# Patient Record
Sex: Female | Born: 1975 | Race: Black or African American | Hispanic: No | Marital: Married | Smoking: Never smoker
Health system: Southern US, Community
[De-identification: ages and names within clinical notes are randomized; demographics above are authoritative.]

## PROBLEM LIST (undated history)

## (undated) DIAGNOSIS — D649 Anemia, unspecified: Secondary | ICD-10-CM

## (undated) DIAGNOSIS — G4730 Sleep apnea, unspecified: Secondary | ICD-10-CM

## (undated) DIAGNOSIS — F329 Major depressive disorder, single episode, unspecified: Secondary | ICD-10-CM

## (undated) DIAGNOSIS — E669 Obesity, unspecified: Secondary | ICD-10-CM

## (undated) DIAGNOSIS — F32A Depression, unspecified: Secondary | ICD-10-CM

## (undated) DIAGNOSIS — T7840XA Allergy, unspecified, initial encounter: Secondary | ICD-10-CM

## (undated) DIAGNOSIS — R0602 Shortness of breath: Secondary | ICD-10-CM

## (undated) DIAGNOSIS — M259 Joint disorder, unspecified: Secondary | ICD-10-CM

## (undated) DIAGNOSIS — E569 Vitamin deficiency, unspecified: Secondary | ICD-10-CM

## (undated) DIAGNOSIS — G43909 Migraine, unspecified, not intractable, without status migrainosus: Secondary | ICD-10-CM

## (undated) DIAGNOSIS — E559 Vitamin D deficiency, unspecified: Secondary | ICD-10-CM

## (undated) DIAGNOSIS — M62830 Muscle spasm of back: Secondary | ICD-10-CM

## (undated) DIAGNOSIS — M2550 Pain in unspecified joint: Secondary | ICD-10-CM

## (undated) DIAGNOSIS — B009 Herpesviral infection, unspecified: Secondary | ICD-10-CM

## (undated) DIAGNOSIS — D219 Benign neoplasm of connective and other soft tissue, unspecified: Secondary | ICD-10-CM

## (undated) HISTORY — DX: Obesity, unspecified: E66.9

## (undated) HISTORY — DX: Vitamin D deficiency, unspecified: E55.9

## (undated) HISTORY — PX: TUMOR REMOVAL: SHX12

## (undated) HISTORY — DX: Major depressive disorder, single episode, unspecified: F32.9

## (undated) HISTORY — DX: Herpesviral infection, unspecified: B00.9

## (undated) HISTORY — DX: Benign neoplasm of connective and other soft tissue, unspecified: D21.9

## (undated) HISTORY — DX: Sleep apnea, unspecified: G47.30

## (undated) HISTORY — DX: Vitamin deficiency, unspecified: E56.9

## (undated) HISTORY — DX: Muscle spasm of back: M62.830

## (undated) HISTORY — DX: Allergy, unspecified, initial encounter: T78.40XA

## (undated) HISTORY — DX: Shortness of breath: R06.02

## (undated) HISTORY — DX: Pain in unspecified joint: M25.50

## (undated) HISTORY — DX: Migraine, unspecified, not intractable, without status migrainosus: G43.909

## (undated) HISTORY — DX: Anemia, unspecified: D64.9

## (undated) HISTORY — DX: Depression, unspecified: F32.A

## (undated) HISTORY — DX: Joint disorder, unspecified: M25.9

---

## 1998-07-13 ENCOUNTER — Emergency Department (HOSPITAL_COMMUNITY): Admission: EM | Admit: 1998-07-13 | Discharge: 1998-07-14 | Payer: Self-pay

## 2001-04-02 ENCOUNTER — Emergency Department (HOSPITAL_COMMUNITY): Admission: EM | Admit: 2001-04-02 | Discharge: 2001-04-02 | Payer: Self-pay

## 2003-05-03 ENCOUNTER — Emergency Department (HOSPITAL_COMMUNITY): Admission: EM | Admit: 2003-05-03 | Discharge: 2003-05-03 | Payer: Self-pay | Admitting: Emergency Medicine

## 2003-05-03 ENCOUNTER — Encounter: Payer: Self-pay | Admitting: Emergency Medicine

## 2003-05-23 ENCOUNTER — Encounter: Payer: Self-pay | Admitting: Cardiology

## 2003-05-23 ENCOUNTER — Encounter: Admission: RE | Admit: 2003-05-23 | Discharge: 2003-05-23 | Payer: Self-pay | Admitting: Cardiology

## 2004-03-26 ENCOUNTER — Emergency Department (HOSPITAL_COMMUNITY): Admission: EM | Admit: 2004-03-26 | Discharge: 2004-03-26 | Payer: Self-pay | Admitting: Emergency Medicine

## 2004-04-04 ENCOUNTER — Ambulatory Visit (HOSPITAL_BASED_OUTPATIENT_CLINIC_OR_DEPARTMENT_OTHER): Admission: RE | Admit: 2004-04-04 | Discharge: 2004-04-04 | Payer: Self-pay | Admitting: Cardiology

## 2008-01-31 ENCOUNTER — Emergency Department (HOSPITAL_COMMUNITY): Admission: EM | Admit: 2008-01-31 | Discharge: 2008-01-31 | Payer: Self-pay | Admitting: Emergency Medicine

## 2008-08-23 ENCOUNTER — Encounter: Admission: RE | Admit: 2008-08-23 | Discharge: 2008-08-23 | Payer: Self-pay | Admitting: Obstetrics and Gynecology

## 2008-08-28 ENCOUNTER — Encounter: Admission: RE | Admit: 2008-08-28 | Discharge: 2008-08-28 | Payer: Self-pay | Admitting: Obstetrics and Gynecology

## 2009-08-11 ENCOUNTER — Encounter: Admission: RE | Admit: 2009-08-11 | Discharge: 2009-08-11 | Payer: Self-pay | Admitting: Obstetrics and Gynecology

## 2009-09-14 ENCOUNTER — Ambulatory Visit (HOSPITAL_COMMUNITY): Admission: RE | Admit: 2009-09-14 | Discharge: 2009-09-16 | Payer: Self-pay | Admitting: Obstetrics and Gynecology

## 2009-09-14 ENCOUNTER — Encounter (INDEPENDENT_AMBULATORY_CARE_PROVIDER_SITE_OTHER): Payer: Self-pay | Admitting: Obstetrics and Gynecology

## 2010-11-15 ENCOUNTER — Ambulatory Visit (HOSPITAL_COMMUNITY)
Admission: RE | Admit: 2010-11-15 | Discharge: 2010-11-15 | Payer: Self-pay | Source: Home / Self Care | Attending: Psychiatry | Admitting: Psychiatry

## 2010-11-18 ENCOUNTER — Other Ambulatory Visit (HOSPITAL_COMMUNITY)
Admission: RE | Admit: 2010-11-18 | Discharge: 2010-12-10 | Payer: Self-pay | Source: Home / Self Care | Attending: Psychiatry | Admitting: Psychiatry

## 2010-12-22 ENCOUNTER — Encounter: Payer: Self-pay | Admitting: Internal Medicine

## 2010-12-23 ENCOUNTER — Encounter: Payer: Self-pay | Admitting: Obstetrics and Gynecology

## 2011-03-06 LAB — CBC
HCT: 36.5 % (ref 36.0–46.0)
MCHC: 32.8 g/dL (ref 30.0–36.0)
MCHC: 32.9 g/dL (ref 30.0–36.0)
MCV: 82.5 fL (ref 78.0–100.0)
MCV: 82.3 fL (ref 78.0–100.0)
Platelets: 345 10*3/uL (ref 150–400)
Platelets: 344 10*3/uL (ref 150–400)
RBC: 4.44 MIL/uL (ref 3.87–5.11)
RDW: 15.2 % (ref 11.5–15.5)

## 2011-03-06 LAB — BASIC METABOLIC PANEL
Calcium: 9.1 mg/dL (ref 8.4–10.5)
Creatinine, Ser: 0.68 mg/dL (ref 0.4–1.2)
GFR calc Af Amer: >60 mL/min (ref >60)
GFR calc non Af Amer: >60 mL/min (ref >60)

## 2011-03-06 LAB — TYPE AND SCREEN: ABO/RH(D): B POS

## 2011-03-06 LAB — PREGNANCY, URINE: Preg Test, Ur: NEGATIVE

## 2011-03-06 LAB — ABO/RH: ABO/RH(D): B POS

## 2011-08-25 LAB — URINALYSIS, ROUTINE W REFLEX MICROSCOPIC
Bilirubin Urine: NEGATIVE
Nitrite: NEGATIVE
Specific Gravity, Urine: 1.03
Urobilinogen, UA: 1
pH: 5.5

## 2011-08-25 LAB — HCG, QUANTITATIVE, PREGNANCY: hCG, Beta Chain, Quant, S: 524 — ABNORMAL HIGH

## 2012-01-27 ENCOUNTER — Ambulatory Visit (INDEPENDENT_AMBULATORY_CARE_PROVIDER_SITE_OTHER): Payer: 59 | Admitting: Physician Assistant

## 2012-01-27 VITALS — BP 125/87 | HR 69 | Temp 98.3°F | Resp 16 | Ht 69.25 in | Wt 312.6 lb

## 2012-01-27 DIAGNOSIS — R21 Rash and other nonspecific skin eruption: Secondary | ICD-10-CM

## 2012-01-27 DIAGNOSIS — L299 Pruritus, unspecified: Secondary | ICD-10-CM

## 2012-01-27 DIAGNOSIS — J029 Acute pharyngitis, unspecified: Secondary | ICD-10-CM

## 2012-01-27 MED ORDER — MOMETASONE FUROATE 50 MCG/ACT NA SUSP: 2.0000 | Freq: Every day | NASAL | Status: DC

## 2012-01-27 MED ORDER — CETIRIZINE HCL 10 MG PO TABS: 10.0000 mg | ORAL_TABLET | Freq: Every day | ORAL | Status: DC

## 2012-01-27 MED ORDER — RANITIDINE HCL 300 MG PO TABS
300.0000 mg | ORAL_TABLET | Freq: Every day | ORAL | Status: DC
Start: 2012-01-27 — End: 2012-09-21

## 2012-01-27 MED ORDER — TRIAMCINOLONE ACETONIDE 0.5 % EX CREA: TOPICAL_CREAM | Freq: Two times a day (BID) | CUTANEOUS | Status: DC

## 2012-01-27 NOTE — Progress Notes (Signed)
  Subjective:    Patient ID: Lauren Daniels, female    DOB: 11/07/1976, 36 y.o.   MRN: [id number]  HPI Pt presents with 2 concerns 1-rash on B lower legs, R>L, started on Sat after going to the movies - got home and was really itching and noticed a few red spots.  The bumps have not changed in number or size but they are still very itching and swollen/skin tight.  She has used no meds.  Her boyfriend at movies also without any bumps.  Does not remember anything biting her.   [facility], went to another urgent care told her it may be strep and gave her prednisone which she did not start taking because it did not make sense to her. 2-sore/scratchy throat for weeks.  Used no meds has been ignoring it, has no congestion but does have h/o seasonal allergies and tends to ignore those symptoms.   Review of Systems  Constitutional: Negative for fever and chills.  HENT: Positive for congestion and sore throat (scratchy). Negative for rhinorrhea and postnasal drip.   Gastrointestinal:       No h/o heartburn symptoms.   Skin: Positive for rash.       Objective:   Physical Exam  Constitutional: She appears well-developed and well-nourished.  HENT:  Head: Normocephalic and atraumatic.  Right Ear: External ear normal.  Left Ear: External ear normal.  Nose: Mucosal edema (pale boggy) present.  Mouth/Throat: Oropharynx is clear and moist. No oropharyngeal exudate.  Lymphadenopathy:    She has no cervical adenopathy.  Skin: Skin is warm. Rash noted.             Assessment & Plan:   1. Rash  cetirizine (ZYRTEC) 10 MG tablet, ranitidine (ZANTAC) 300 MG tablet, triamcinolone cream (KENALOG) 0.5 %  2. Itching  cetirizine (ZYRTEC) 10 MG tablet, ranitidine (ZANTAC) 300 MG tablet, triamcinolone cream (KENALOG) 0.5 %  3. Acute pharyngitis  mometasone (NASONEX) 50 MCG/ACT nasal spray   Rash is bites of some sort.  ?where she got them but definitely from Sat  night at start of symptoms.

## 2012-03-14 ENCOUNTER — Emergency Department (HOSPITAL_COMMUNITY)
Admission: EM | Admit: 2012-03-14 | Discharge: 2012-03-14 | Disposition: A | Discharge status: Home / Self Care | Payer: 59 | Attending: Emergency Medicine | Admitting: Emergency Medicine

## 2012-03-14 ENCOUNTER — Encounter (HOSPITAL_COMMUNITY): Payer: Self-pay

## 2012-03-14 DIAGNOSIS — Z79899 Other long term (current) drug therapy: Secondary | ICD-10-CM | POA: Insufficient documentation

## 2012-03-14 DIAGNOSIS — R21 Rash and other nonspecific skin eruption: Secondary | ICD-10-CM

## 2012-03-14 DIAGNOSIS — M79609 Pain in unspecified limb: Secondary | ICD-10-CM | POA: Insufficient documentation

## 2012-03-14 MED ORDER — CEPHALEXIN 500 MG PO CAPS: 500.0000 mg | ORAL_CAPSULE | Freq: Four times a day (QID) | ORAL | Status: AC

## 2012-03-14 MED ORDER — HYDROCORTISONE 1 % EX CREA: TOPICAL_CREAM | CUTANEOUS | Status: DC

## 2012-03-14 MED ORDER — OXYCODONE-ACETAMINOPHEN 5-325 MG PO TABS
2.0000 | ORAL_TABLET | Freq: Once | ORAL | Status: AC
  Administered 2012-03-14: 2 via ORAL
  Filled 2012-03-14: qty 2

## 2012-03-14 MED ORDER — OXYCODONE-ACETAMINOPHEN 5-325 MG PO TABS: 2.0000 | ORAL_TABLET | ORAL | Status: AC | PRN

## 2012-03-14 MED ORDER — ONDANSETRON 4 MG PO TBDP
4.0000 mg | ORAL_TABLET | Freq: Once | ORAL | Status: AC
  Administered 2012-03-14: 4 mg via ORAL
  Filled 2012-03-14: qty 1

## 2012-03-14 MED ORDER — CLINDAMYCIN HCL 300 MG PO CAPS
300.0000 mg | ORAL_CAPSULE | Freq: Once | ORAL | Status: AC
  Administered 2012-03-14: 300 mg via ORAL
  Filled 2012-03-14: qty 1

## 2012-03-14 MED ORDER — SULFAMETHOXAZOLE-TRIMETHOPRIM 800-160 MG PO TABS: 1.0000 | ORAL_TABLET | Freq: Two times a day (BID) | ORAL | Status: AC

## 2012-03-14 NOTE — Discharge Instructions (Signed)
Rash A rash is a change in the color or texture of your skin. There are many different types of rashes. You may have other problems that accompany your rash. CAUSES   Infections.   Allergic reactions. This can include allergies to pets or foods.   Certain medicines.   Exposure to certain chemicals, soaps, or cosmetics.   Heat.   Exposure to poisonous plants.   Tumors, both cancerous and noncancerous.  SYMPTOMS   Redness.   Scaly skin.   Itchy skin.   Dry or cracked skin.   Bumps.   Blisters.   Pain.  DIAGNOSIS  Your caregiver may do a physical exam to determine what type of rash you have. A skin sample (biopsy) may be taken and examined under a microscope. TREATMENT  Treatment depends on the type of rash you have. Your caregiver may prescribe certain medicines. For serious conditions, you may need to see a skin doctor (dermatologist). HOME CARE INSTRUCTIONS   Avoid the substance that caused your rash.   Do not scratch your rash. This can cause infection.   You may take cool baths to help stop itching.   Only take over-the-counter or prescription medicines as directed by your caregiver.   Keep all follow-up appointments as directed by your caregiver.  SEEK IMMEDIATE MEDICAL CARE IF:  You have increasing pain, swelling, or redness.   You have a fever.   You have new or severe symptoms.   You have body aches, diarrhea, or vomiting.   Your rash is not better after 3 days.  MAKE SURE YOU:  Understand these instructions.   Will watch your condition.   Will get help right away if you are not doing well or get worse.  Document Released: 11/07/2002 Document Revised: 11/06/2011 Document Reviewed: 09/01/2011 Exit[employer] Patient Information 2012 [employer]. 

## 2012-03-14 NOTE — ED Provider Notes (Signed)
History     CSN: [id number]  Arrival date & time 03/14/12  2029   First MD Initiated Contact with Patient 03/14/12 2042      Chief Complaint  Patient presents with  . Cellulitis    (Consider location/radiation/quality/duration/timing/severity/associated sxs/prior treatment) HPI  Pt presents to the ED with complaints of rash on her right shin. The rash has been going on for 3 days. The patient thinks she was stung or bite by a bug but is not sure. She says that their was a bump previously. She denies fevers, chills, weakness, difficulty ambulating. The area is very painful.  History reviewed. No pertinent past medical history.  Past Surgical History  Procedure Date  . Tumor removal     fibroid    History reviewed. No pertinent family history.  History  Substance Use Topics  . Smoking status: Never Smoker   . Smokeless tobacco: Not on file  . Alcohol Use: No    OB History    Grav Para Term Preterm Abortions TAB SAB Ect Mult Living                  Review of Systems  All other systems reviewed and are negative.    Allergies  Review of patient's allergies indicates no known allergies.  Home Medications   Current Outpatient Rx  Name Route Sig Dispense Refill  . MOMETASONE FUROATE 50 MCG/ACT NA SUSP Nasal Place 2 sprays into the nose daily. 17 g 12  . CEPHALEXIN 500 MG PO CAPS Oral Take 1 capsule (500 mg total) by mouth 4 (four) times daily. 28 capsule 0  . CETIRIZINE HCL 10 MG PO TABS Oral Take 1 tablet (10 mg total) by mouth daily. 14 tablet 0  . HYDROCORTISONE 1 % EX CREA  Apply to affected area 2 times daily 15 g 0  . OXYCODONE-ACETAMINOPHEN 5-325 MG PO TABS Oral Take 2 tablets by mouth every 4 (four) hours as needed for pain. 6 tablet 0  . RANITIDINE HCL 300 MG PO TABS Oral Take 1 tablet (300 mg total) by mouth at bedtime. 14 tablet 0  . SULFAMETHOXAZOLE-TRIMETHOPRIM 800-160 MG PO TABS Oral Take 1 tablet by mouth every 12 (twelve) hours. 10 tablet 0  .  TRIAMCINOLONE ACETONIDE 0.5 % EX CREA Topical Apply topically 2 (two) times daily. 30 g 0    BP 126/75  Pulse 91  Temp(Src) 98.8 F (37.1 C) (Oral)  Resp 18  SpO2 100%  LMP 02/15/2012  Physical Exam  Nursing note and vitals reviewed. Constitutional: She appears well-developed and well-nourished. No distress.  HENT:  Head: Normocephalic and atraumatic.  Eyes: Pupils are equal, round, and reactive to light.  Neck: Normal range of motion. Neck supple.  Cardiovascular: Normal rate and regular rhythm.   Pulmonary/Chest: Effort normal.  Abdominal: Soft.  Neurological: She is alert.  Skin: Skin is warm and dry. Rash noted.          Rash 10cm by 4 cm. Spares the rest of the body. It is red and tender to touch. It is flat, the borders are irregular. No crepitus or skin peeling. No bite wound noted, no puss.     ED Course  Procedures (including critical care time)  Labs Reviewed - No data to display No results found.   1. Rash       MDM  Pt given first dose of abx in ED. She has good follow-up with her PCP and is to see her this Wednesday. Rash  was outlined, she is to return to the ED if it worsens. Pt started on hydrocortisone cream and keflex and bactrim.  The rash is painful but it is non indurated. The rash may have infectious component vs allergy.   Pt has been advised of the symptoms that warrant their return to the ED. Patient has voiced understanding and has agreed to follow-up with the PCP or specialist.         Dorthula Matas, PA 03/14/12 2239

## 2012-03-18 NOTE — ED Provider Notes (Signed)
Medical screening examination/treatment/procedure(s) were performed by non-physician practitioner and as supervising physician I was immediately available for consultation/collaboration.  Aniston Christman, MD 03/18/12 1750 

## 2012-04-28 ENCOUNTER — Telehealth: Payer: Self-pay | Admitting: Obstetrics and Gynecology

## 2012-04-28 NOTE — Telephone Encounter (Signed)
Triage/cht received 

## 2012-04-28 NOTE — Telephone Encounter (Signed)
Lm on pt's voice mail to call back rgd msg. bt cma

## 2012-04-29 ENCOUNTER — Telehealth: Payer: Self-pay | Admitting: Obstetrics and Gynecology

## 2012-04-29 NOTE — Telephone Encounter (Signed)
Left 2nd msg on pt's voice mail to call back rgd msg.bt cma

## 2012-05-03 ENCOUNTER — Encounter: Payer: Self-pay | Admitting: Obstetrics and Gynecology

## 2012-05-03 NOTE — Telephone Encounter (Signed)
Spoke with pt rgd msg. Pt stated she might have BV or yeast infection. Made app with EP on 05/05/12 @ 3:30. pts voice understanding. BT CMA

## 2012-05-05 ENCOUNTER — Encounter: Payer: Self-pay | Admitting: Obstetrics and Gynecology

## 2012-05-05 ENCOUNTER — Ambulatory Visit (INDEPENDENT_AMBULATORY_CARE_PROVIDER_SITE_OTHER): Payer: 59 | Admitting: Obstetrics and Gynecology

## 2012-05-05 VITALS — BP 120/82 | Temp 97.6°F | Ht 70.5 in | Wt 324.0 lb

## 2012-05-05 DIAGNOSIS — D219 Benign neoplasm of connective and other soft tissue, unspecified: Secondary | ICD-10-CM | POA: Insufficient documentation

## 2012-05-05 DIAGNOSIS — E559 Vitamin D deficiency, unspecified: Secondary | ICD-10-CM | POA: Insufficient documentation

## 2012-05-05 DIAGNOSIS — E569 Vitamin deficiency, unspecified: Secondary | ICD-10-CM

## 2012-05-05 DIAGNOSIS — D259 Leiomyoma of uterus, unspecified: Secondary | ICD-10-CM

## 2012-05-05 DIAGNOSIS — B3731 Acute candidiasis of vulva and vagina: Secondary | ICD-10-CM

## 2012-05-05 DIAGNOSIS — N898 Other specified noninflammatory disorders of vagina: Secondary | ICD-10-CM

## 2012-05-05 DIAGNOSIS — N926 Irregular menstruation, unspecified: Secondary | ICD-10-CM

## 2012-05-05 DIAGNOSIS — B009 Herpesviral infection, unspecified: Secondary | ICD-10-CM

## 2012-05-05 DIAGNOSIS — B373 Candidiasis of vulva and vagina: Secondary | ICD-10-CM

## 2012-05-05 LAB — POCT WET PREP (WET MOUNT)
Whiff Test: NEGATIVE
Yeast: POSITIVE
pH: 5

## 2012-05-05 LAB — POCT URINE PREGNANCY: Preg Test, Ur: NEGATIVE

## 2012-05-05 MED ORDER — FLUCONAZOLE 150 MG PO TABS: 150.0000 mg | ORAL_TABLET | Freq: Once | ORAL | Status: AC

## 2012-05-05 NOTE — Progress Notes (Signed)
35 YO complains of pruritic vaginal discharge x 2 weeks.  Hasn't used anything for it. Period only lasted 2 days this time usually lasts 5 days with pad change 4/day but only 2/day with no cramps.  O:Wet Prep: pH 5.0; whiff-negative; yeast +    UPT-negative  Pelvic: EGBUS-wnl, vagina-scant pink discharge, cervix-no lesions, uterus-no tenderness, adnexae-no tenderness   A:  Yeast Vaginitis       Irregular Menses  P: Diflucan 150 mg #1  1 po stat 1 rf      Prior to discharge patient mentioned episodes of sudden     urinary urgency for many years.  No pattern to it but it     happens most days and she's had "accidents" of leaking     Advised to schedule follow up with M.D.  Alailah Safley, PA-C

## 2012-05-05 NOTE — Progress Notes (Signed)
Color: white Odor: yes Itching:yes Thin:no Thick:yes Fever:no Dyspareunia:no Hx PID:no HX STD:yes hsv 2 Pelvic Pain:no Desires Gc/CT:no Desires HIV,RPR,HbsAG:no

## 2012-05-10 ENCOUNTER — Telehealth: Payer: Self-pay | Admitting: Obstetrics and Gynecology

## 2012-05-10 NOTE — Telephone Encounter (Signed)
Triage/epic 

## 2012-05-11 NOTE — Telephone Encounter (Signed)
EP,        PT WAS SEEN ON 05/05/12 FOR YEAST INFECTION. PT RECEIVED PILLS AND WANT TO KNOW CAN SHE GET CREAM INSTEAD.  LM

## 2012-05-12 ENCOUNTER — Telehealth: Payer: Self-pay | Admitting: Obstetrics and Gynecology

## 2012-05-12 NOTE — Telephone Encounter (Signed)
Spoke with pt rgd msg pt wanted check status of rx pt states called Monday for rx for cream for yeast infection pt seen 05/05/12 with EP was given rx diflucan pt states was told if no relief from pills call to get cream advised pt her assistant sent msg rgd request we are waiting on a response from provider to see if ok to call in rx pt became irate advised pt can use monistat otc pt states she didn't want to pay out of pocket for rx if she can get a rx and use flex card apologized for inconvenience  pt then demanded to speak to someone else a provider in office advised pt they are seeing pt and one of their assistants will call her back pt states she didn't want a cb she wants to speak to someone now pt wants to speak to a supervisor advised pt will have deborah call her back once she finish with procedure pt still irate say she will keep calling until she gets rx advised pt will forward msg to supervisor call ended

## 2012-05-12 NOTE — Telephone Encounter (Signed)
Triage/epic 

## 2012-05-12 NOTE — Telephone Encounter (Signed)
Patient recently treated for yeast vaginitis with Diflucan requesting cream since Diflucan did not relieve her symptoms. Patient may have Terazol 7 Vaginal Cream # 1 tube 1 applicatorful pv qhs x 7 days with no refills.  Jamillia Closson, PA-C

## 2012-05-13 ENCOUNTER — Telehealth: Payer: Self-pay

## 2012-05-13 NOTE — Telephone Encounter (Signed)
LM ON PT VM TO INFORM HER THAT SHE CAN PICK UP RX SHE REQUESTED PER EP. CALL IN RX TERAZOL 7 VAGINAL CREAM #1 APPLICATORFUL PV QHS X 7 DAYS WITH NO REFILLS TO PT PHARMACY.

## 2012-05-13 NOTE — Telephone Encounter (Signed)
DONE

## 2012-05-26 ENCOUNTER — Ambulatory Visit: Payer: 59 | Admitting: Obstetrics and Gynecology

## 2012-06-17 ENCOUNTER — Ambulatory Visit: Payer: 59 | Admitting: Obstetrics and Gynecology

## 2012-06-25 ENCOUNTER — Encounter: Payer: Self-pay | Admitting: Obstetrics and Gynecology

## 2012-07-02 ENCOUNTER — Telehealth: Payer: Self-pay

## 2012-09-21 ENCOUNTER — Ambulatory Visit (INDEPENDENT_AMBULATORY_CARE_PROVIDER_SITE_OTHER): Payer: 59 | Admitting: Family Medicine

## 2012-09-21 VITALS — BP 115/76 | HR 76 | Temp 98.4°F | Resp 16 | Ht 69.5 in | Wt 321.0 lb

## 2012-09-21 DIAGNOSIS — J029 Acute pharyngitis, unspecified: Secondary | ICD-10-CM

## 2012-09-21 DIAGNOSIS — G4700 Insomnia, unspecified: Secondary | ICD-10-CM

## 2012-09-21 DIAGNOSIS — H109 Unspecified conjunctivitis: Secondary | ICD-10-CM

## 2012-09-21 DIAGNOSIS — H938X9 Other specified disorders of ear, unspecified ear: Secondary | ICD-10-CM

## 2012-09-21 MED ORDER — AMOXICILLIN 875 MG PO TABS: 875.0000 mg | ORAL_TABLET | Freq: Two times a day (BID) | ORAL | Status: DC

## 2012-09-21 MED ORDER — TOBRAMYCIN 0.3 % OP SOLN: 1.0000 [drp] | Freq: Four times a day (QID) | OPHTHALMIC | Status: DC

## 2012-09-21 NOTE — Progress Notes (Signed)
35 yo with a week and a half of injected, sticky eyes; sore throat; ear fullness; myalgias.   Associated with fever (101) at onset along with some lightheadedness.  She saw physician in [place] last week who prescribed sudafed  Works at [employer] in [place]  Objective: NAD Eyes:  Injected, L>R with discharge, fundi benign Nose:  Inflamed with mucopurulent discharge Oroph:  Reddened with small ulcers left posterior pharynx and yellow PND Neck:   Supple, no adenopathy TM's:  Normal  Assessment:  Pharyngitis and conjunctivitis

## 2012-09-28 ENCOUNTER — Encounter: Payer: 59 | Admitting: Emergency Medicine

## 2012-09-28 NOTE — Progress Notes (Signed)
  Subjective:    Patient ID: Lauren Daniels, female    DOB: 11-14-1976, 36 y.o.   MRN: [id number]  HPI  NOT SEEN  Review of Systems NOT SEEN     Objective:   Physical Exam  NOT SEEN      Assessment & Plan:  NOT SEEN This encounter was created in error - please disregard.

## 2012-10-15 ENCOUNTER — Ambulatory Visit (INDEPENDENT_AMBULATORY_CARE_PROVIDER_SITE_OTHER): Payer: 59 | Admitting: Emergency Medicine

## 2012-10-15 VITALS — BP 136/82 | HR 100 | Temp 98.0°F | Resp 17 | Ht 70.0 in | Wt 320.0 lb

## 2012-10-15 DIAGNOSIS — Z0000 Encounter for general adult medical examination without abnormal findings: Secondary | ICD-10-CM

## 2012-10-15 LAB — POCT URINALYSIS DIPSTICK
Bilirubin, UA: NEGATIVE
Glucose, UA: NEGATIVE
Nitrite, UA: NEGATIVE
Spec Grav, UA: 1.015
Urobilinogen, UA: 0.2

## 2012-10-15 LAB — COMPREHENSIVE METABOLIC PANEL
Albumin: 4.3 g/dL (ref 3.5–5.2)
Alkaline Phosphatase: 55 U/L (ref 39–117)
BUN: 7 mg/dL (ref 6–23)
Calcium: 9.5 mg/dL (ref 8.4–10.5)
Chloride: 104 mEq/L (ref 96–112)
Glucose, Bld: 84 mg/dL (ref 70–99)
Potassium: 4.4 mEq/L (ref 3.5–5.3)
Sodium: 138 mEq/L (ref 135–145)
Total Protein: 7.3 g/dL (ref 6.0–8.3)

## 2012-10-15 LAB — POCT CBC
Granulocyte percent: 43.1 %G (ref 37–80)
HCT, POC: 41.5 % (ref 37.7–47.9)
Hemoglobin: 12.2 g/dL (ref 12.2–16.2)
MCV: 83.7 fL (ref 80–97)
POC Granulocyte: 2.5 (ref 2–6.9)
POC LYMPH PERCENT: 50.6 %L — AB (ref 10–50)
RBC: 4.96 M/uL (ref 4.04–5.48)
RDW, POC: 16.4 %

## 2012-10-15 LAB — POCT UA - MICROSCOPIC ONLY

## 2012-10-15 LAB — TSH: TSH: 1.683 u[IU]/mL (ref 0.350–4.500)

## 2012-10-15 LAB — LIPID PANEL
LDL Cholesterol: 87 mg/dL (ref 0–99)
Triglycerides: 63 mg/dL (ref <150)

## 2012-10-15 NOTE — Patient Instructions (Addendum)
Obesity Obesity is defined as having too much total body fat and a body mass index (BMI) of 30 or more. BMI is an estimate of body fat and is calculated from your height and weight. Obesity happens when you consume more calories than you can burn by exercising or performing daily physical tasks. Prolonged obesity can cause major illnesses or emergencies, such as:   A stroke.  Heart disease.  Diabetes.  Cancer.  Arthritis.  High blood pressure (hypertension).  High cholesterol.  Sleep apnea.  Erectile dysfunction.  Infertility problems. CAUSES   Regularly eating unhealthy foods.  Physical inactivity.  Certain disorders, such as an underactive thyroid (hypothyroidism), Cushing's syndrome, and polycystic ovarian syndrome.  Certain medicines, such as steroids, some depression medicines, and antipsychotics.  Genetics.  Lack of sleep. DIAGNOSIS  A caregiver can diagnose obesity after calculating your BMI. Obesity will be diagnosed if your BMI is 30 or higher.  There are other methods of measuring obesity levels. Some other methods include measuring your skin fold thickness, your waist circumference, and comparing your hip circumference to your waist circumference. TREATMENT  A healthy treatment program includes some or all of the following:  Long-term dietary changes.  Exercise and physical activity.  Behavioral and lifestyle changes.  Medicine only under the supervision of your caregiver. Medicines may help, but only if they are used with diet and exercise programs. An unhealthy treatment program includes:  Fasting.  Fad diets.  Supplements and drugs. These choices do not succeed in long-term weight control.  HOME CARE INSTRUCTIONS   Exercise and perform physical activity as directed by your caregiver. To increase physical activity, try the following:  Use stairs instead of elevators.  Park farther away from store entrances.  Garden, bike, or walk instead of  watching television or using the computer.  Eat healthy, low-calorie foods and drinks on a regular basis. Eat more fruits and vegetables. Use low-calorie cookbooks or take healthy cooking classes.  Limit fast food, sweets, and processed snack foods.  Eat smaller portions.  Keep a daily journal of everything you eat. There are many free websites to help you with this. It may be helpful to measure your foods so you can determine if you are eating the correct portion sizes.  Avoid drinking alcohol. Drink more water and drinks without calories.  Take vitamins and supplements only as recommended by your caregiver.  Weight-loss support groups, Registered Dieticians, counselors, and stress reduction education can also be very helpful. SEEK IMMEDIATE MEDICAL CARE IF:  You have chest pain or tightness.  You have trouble breathing or feel short of breath.  You have weakness or leg numbness.  You feel confused or have trouble talking.  You have sudden changes in your vision. MAKE SURE YOU:  Understand these instructions.  Will watch your condition.  Will get help right away if you are not doing well or get worse. Document Released: 12/25/2004 Document Revised: 05/18/2012 Document Reviewed: 12/24/2011 Exit[employer] Patient Information 2013 [employer].  

## 2012-10-15 NOTE — Progress Notes (Signed)
@[email or web address] @  Patient ID: Lauren Daniels MRN: [id number], DOB: 1976/08/25, 36 y.o. Date of Encounter: 10/15/2012, 9:08 AM  Primary Physician: Pcp Not In System  Chief Complaint: Physical (CPE)  HPI: 36 y.o. y/o female with history of noted below here for CPE.  Doing well. Needs complete PE for work.    LMP: 10/10/12 WUJ:[id number] performs PAP's. MMG: Review of Systems:  Consitutional: No fever, chills, fatigue, night sweats, lymphadenopathy, or weight changes. Eyes: No visual changes, eye redness, or discharge. ENT/Mouth: Ears: No otalgia, tinnitus, hearing loss, discharge. Nose: No congestion, rhinorrhea, sinus pain, or epistaxis. Throat: No sore throat, post nasal drip, or teeth pain. Cardiovascular: No CP, palpitations, diaphoresis, DOE, edema, orthopnea, PND. Respiratory: She has a history of exercise-induced asthma she has Dulera to take but rarely takes it. As a pro-air inhaler to use as  Gastrointestinal: No anorexia, dysphagia, reflux, pain, nausea, vomiting, hematemesis, diarrhea, constipation, BRBPR, or melena. Breast: No discharge, pain, swelling, or mass. Genitourinary: No dysuria, frequency, urgency, hematuria, incontinence, nocturia, amenorrhea, vaginal discharge, pruritis, burning, abnormal bleeding, or pain. Musculoskeletal: Bilateral hip pain (toss and turn during the night) Obese. No decreased ROM, myalgias, stiffness, joint swelling, or weakness.  Skin: No rash, erythema, lesion changes, pain, warmth, jaundice, or pruritis. Neurological: + dizziness off and during mensus, syncope, seizures, tremors, memory loss, coordination problems, or paresthesias. Psychological: No anxiety, depression, hallucinations, SI/HI. Endocrine: No fatigue, polydipsia, polyphagia, polyuria, or known diabetes. SLEEP: Has trouble slee All other systems were reviewed and are otherwise negative.  Past Medical History  Diagnosis Date  . Fibroid   . Vitamin deficiency     HX OF LOW VIT D   .  HSV-2 (herpes simplex virus 2) infection   . Allergy      Past Surgical History  Procedure Date  . Tumor removal     fibroid    Home Meds:  Prior to Admission medications   Medication Sig Start Date End Date Taking? Authorizing Provider  amoxicillin (AMOXIL) 875 MG tablet Take 1 tablet (875 mg total) by mouth 2 (two) times daily. 09/21/12   Elvina Sidle, MD  tobramycin (TOBREX) 0.3 % ophthalmic solution Place 1 drop into both eyes every 6 (six) hours. 09/21/12   Elvina Sidle, MD    Allergies:  Allergies  Allergen Reactions  . Eye Drops (Naphazoline-Polyethyl Glycol)     History   Social History  . Marital Status: Single    Spouse Name: N/A    Number of Children: N/A  . Years of Education: N/A   Occupational History  . Not on file.   Social History Main Topics  . Smoking status: Never Smoker   . Smokeless tobacco: Never Used  . Alcohol Use: No  . Drug Use: No  . Sexually Active: Yes    Birth Control/ Protection: None   Other Topics Concern  . Not on file   Social History Narrative  . No narrative on file    Family History  Problem Relation Age of Onset  . Breast cancer Mother 14  . Breast cancer Maternal Grandmother 40  . Colon cancer Maternal Grandmother   . Diabetes Father     Physical Exam  Blood pressure 136/82, pulse 100, temperature 98 F (36.7 C), temperature source Oral, resp. rate 17, height 5\' 10"  (1.778 m), weight 320 lb (145.151 kg), last menstrual period 10/09/2012, SpO2 99.00%., Body mass index is 45.92 kg/(m^2). General: Well developed, well nourished, in no acute distress. HEENT: Normocephalic, atraumatic. Conjunctiva pink, sclera non-icteric. Pupils  2 mm constricting to 1 mm, round, regular, and equally reactive to light and accomodation. EOMI. Internal auditory canal clear. TMs with good cone of light and without pathology. Nasal mucosa pink. Nares are without discharge. No sinus tenderness. Oral mucosa pink. Dentition normal .  Pharynx without exudate.   Neck: Supple. Trachea midline. No thyromegaly. Full ROM. No lymphadenopathy. Lungs: Clear to auscultation bilaterally without wheezes, rales, or rhonchi. Breathing is of normal effort and unlabored. Cardiovascular: RRR with S1 S2. No murmurs, rubs, or gallops appreciated. Distal pulses 2+ symmetrically. No carotid or abdominal bruits.  Breast: Not examined   Abdomen: Soft, non-tender, non-distended with normoactive bowel sounds. No hepatosplenomegaly or masses. No rebound/guarding. No CVA tenderness. Without hernias.  Genitourinary:  Not examined   Musculoskeletal: Full range of motion and 5/5 strength throughout. Without swelling, atrophy, tenderness, crepitus, or warmth. Extremities without clubbing, cyanosis, or edema. Calves supple. Skin: Warm and moist without erythema, ecchymosis, wounds, or rash. Neuro: A+Ox3. CN II-XII grossly intact. Moves all extremities spontaneously. Full sensation throughout. Normal gait. DTR 2+ throughout upper and lower extremities. Finger to nose intact. Psych:  Responds to questions appropriately with a normal affect.   Results for orders placed in visit on 10/15/12  POCT CBC      Component Value Range   WBC 5.9  4.6 - 10.2 K/uL   Lymph, poc 3.0  0.6 - 3.4   POC LYMPH PERCENT 50.6 (*) 10 - 50 %L   MID (cbc) 0.4  0 - 0.9   POC MID % 6.3  0 - 12 %M   POC Granulocyte 2.5  2 - 6.9   Granulocyte percent 43.1  37 - 80 %G   RBC 4.96  4.04 - 5.48 M/uL   Hemoglobin 12.2  12.2 - 16.2 g/dL   HCT, POC 16.1  09.6 - 47.9 %   MCV 83.7  80 - 97 fL   MCH, POC 24.6 (*) 27 - 31.2 pg   MCHC 29.4 (*) 31.8 - 35.4 g/dL   RDW, POC 04.5     Platelet Count, POC 424  142 - 424 K/uL   MPV 8.0  0 - 99.8 fL  POCT URINALYSIS DIPSTICK      Component Value Range   Color, UA yellow     Clarity, UA sl cloudy     Glucose, UA neg     Bilirubin, UA neg     Ketones, UA neg     Spec Grav, UA 1.015     Blood, UA large     pH, UA 7.5     Protein, UA neg      Urobilinogen, UA 0.2     Nitrite, UA neg     Leukocytes, UA Negative    POCT UA - MICROSCOPIC ONLY      Component Value Range   WBC, Ur, HPF, POC 0-1     RBC, urine, microscopic 1-3     Bacteria, U Microscopic trace     Mucus, UA neg     Epithelial cells, urine per micros 4-7(2-4)Clue     Crystals, Ur, HPF, POC neg     Casts, Ur, LPF, POC neg     Yeast, UA neg      EKG they're PACs seen no other abnormalities    Assessment/Plan:  36 y.o. y/o female here for CPE. Needs completed for work.  C/o of bilateral hip pain that causes her to toss and turn throughout the night, and when walking. Patient needs to  work on weight loss and exercise. She will continue her regular followup with her GYN. She did have some blood in urine but just finished her menses yesterday. Routine labs were done she states she is up-to-date on her tetanus shot. She declined a flu shot today. We discussed her family history of having breast cancer. She is going to see Dr. Stefano Gaul and decide about a mammogram or a need for an MRI. -  Signed, Earl Lites, MD 10/15/2012 9:08 AM

## 2012-10-19 ENCOUNTER — Encounter: Payer: Self-pay | Admitting: *Deleted

## 2012-10-22 ENCOUNTER — Ambulatory Visit: Payer: 59 | Admitting: Family Medicine

## 2013-04-14 ENCOUNTER — Ambulatory Visit (INDEPENDENT_AMBULATORY_CARE_PROVIDER_SITE_OTHER): Payer: BC Managed Care – PPO | Admitting: Physician Assistant

## 2013-04-14 ENCOUNTER — Ambulatory Visit: Payer: BC Managed Care – PPO

## 2013-04-14 ENCOUNTER — Encounter: Payer: Self-pay | Admitting: Physician Assistant

## 2013-04-14 VITALS — BP 145/75 | HR 105 | Temp 98.3°F | Resp 16 | Ht 69.25 in | Wt 324.6 lb

## 2013-04-14 DIAGNOSIS — B009 Herpesviral infection, unspecified: Secondary | ICD-10-CM

## 2013-04-14 DIAGNOSIS — M5380 Other specified dorsopathies, site unspecified: Secondary | ICD-10-CM

## 2013-04-14 DIAGNOSIS — Z13220 Encounter for screening for lipoid disorders: Secondary | ICD-10-CM

## 2013-04-14 DIAGNOSIS — R5381 Other malaise: Secondary | ICD-10-CM

## 2013-04-14 DIAGNOSIS — E569 Vitamin deficiency, unspecified: Secondary | ICD-10-CM

## 2013-04-14 DIAGNOSIS — M25475 Effusion, left foot: Secondary | ICD-10-CM

## 2013-04-14 DIAGNOSIS — E669 Obesity, unspecified: Secondary | ICD-10-CM

## 2013-04-14 DIAGNOSIS — M62830 Muscle spasm of back: Secondary | ICD-10-CM

## 2013-04-14 DIAGNOSIS — R3915 Urgency of urination: Secondary | ICD-10-CM

## 2013-04-14 DIAGNOSIS — R5383 Other fatigue: Secondary | ICD-10-CM

## 2013-04-14 DIAGNOSIS — M25473 Effusion, unspecified ankle: Secondary | ICD-10-CM

## 2013-04-14 LAB — COMPREHENSIVE METABOLIC PANEL
AST: 14 U/L (ref 0–37)
Albumin: 4.2 g/dL (ref 3.5–5.2)
BUN: 8 mg/dL (ref 6–23)
CO2: 25 mEq/L (ref 19–32)
Calcium: 9.5 mg/dL (ref 8.4–10.5)
Chloride: 104 mEq/L (ref 96–112)
Glucose, Bld: 69 mg/dL — ABNORMAL LOW (ref 70–99)
Potassium: 4.1 mEq/L (ref 3.5–5.3)

## 2013-04-14 LAB — LIPID PANEL
Cholesterol: 154 mg/dL (ref 0–200)
HDL: 49 mg/dL (ref >39)

## 2013-04-14 LAB — POCT URINALYSIS DIPSTICK
Glucose, UA: NEGATIVE
Ketones, UA: NEGATIVE
Protein, UA: NEGATIVE
Spec Grav, UA: <=1.005

## 2013-04-14 LAB — POCT UA - MICROSCOPIC ONLY
Bacteria, U Microscopic: NEGATIVE
Crystals, Ur, HPF, POC: NEGATIVE

## 2013-04-14 LAB — GLUCOSE, POCT (MANUAL RESULT ENTRY): POC Glucose: 74 mg/dl (ref 70–99)

## 2013-04-14 LAB — CBC WITH DIFFERENTIAL/PLATELET
HCT: 35.7 % — ABNORMAL LOW (ref 36.0–46.0)
Hemoglobin: 11.6 g/dL — ABNORMAL LOW (ref 12.0–15.0)
Lymphocytes Relative: 51 % — ABNORMAL HIGH (ref 12–46)
Lymphs Abs: 3.4 10*3/uL (ref 0.7–4.0)
Monocytes Absolute: 0.4 10*3/uL (ref 0.1–1.0)
Monocytes Relative: 6 % (ref 3–12)
Neutro Abs: 2.8 10*3/uL (ref 1.7–7.7)
WBC: 6.7 10*3/uL (ref 4.0–10.5)

## 2013-04-14 LAB — POCT URINE PREGNANCY: Preg Test, Ur: NEGATIVE

## 2013-04-14 MED ORDER — MELOXICAM 15 MG PO TABS: 15.0000 mg | ORAL_TABLET | Freq: Every day | ORAL | Status: DC

## 2013-04-14 MED ORDER — CYCLOBENZAPRINE HCL 10 MG PO TABS: 10.0000 mg | ORAL_TABLET | Freq: Three times a day (TID) | ORAL | Status: DC | PRN

## 2013-04-14 NOTE — Patient Instructions (Signed)
Keep a diary of everything you eat and drink, and of all your exercise.  Bring it with you for review at your follow-up visit.

## 2013-04-14 NOTE — Progress Notes (Signed)
Subjective:    Patient ID: Lauren Daniels, female    DOB: May 24, 1976, 37 y.o.   MRN: [id number]  HPI This 37 y.o. female presents for CPE.  However, during the preparation for her visit with me, she developed sudden onset of debilitating back back.  As such, we elected to manage the acute problem and defer the CPE to a later date.  She had fasting labs drawn this morning.  Leaning over to wipe off the toilet seat, she felt pain and heard a "pop" in the low back.  She sat down, but was unable to stand back up due to the pain.  Two staff members came to her assistance and moved her into an exam room.  The pain is constant, worse with movement.  No pain radiating into the legs.  No loss of bowel or bladder control, no saddle anesthesia.    She's had similar episodes previously, but none this severe.  Also, swelling in LEFT foot x 5+ months-ever since "they took our chairs" at work Psychologist, occupational, works at [employer], counter is lower than her waist, and she also has to lean over to write on the counter.). Pain in the foot x several weeks.  Has tried multiple types of shoes. Has had some hip discomfort. History of severe LEFT foot sprain in 2004.  Past medical history, surgical history, family history, social history and problem list reviewed.   Review of Systems As above.  Also, frustration with difficulty losing weight.  Feels tired a lot. No chest pain, SOB, HA, dizziness, vision change, N/V, diarrhea, constipation, dysuria, urinary urgency or frequency or rash.     Objective:   Physical Exam  Vitals reviewed. Constitutional: She is oriented to person, place, and time. She appears well-developed and well-nourished. She is active and cooperative. She appears distressed (crying.  Unable to be comfortable on the exam table.  All movement hurts.).  Tachycardic, likely due to pain.  HENT:  Head: Normocephalic and atraumatic.  Eyes: Conjunctivae are normal.  Neck: Neck supple.    Cardiovascular: Regular rhythm and normal heart sounds.   Pulmonary/Chest: Effort normal and breath sounds normal. No respiratory distress.  Musculoskeletal: She exhibits tenderness. She exhibits no edema.       Cervical back: Normal.       Thoracic back: Normal.       Lumbar back: She exhibits decreased range of motion, tenderness, bony tenderness and pain. She exhibits no swelling, no edema, no deformity, no laceration, no spasm and normal pulse.       Back:  Neurological: She is alert and oriented to person, place, and time. She has normal strength and normal reflexes. No cranial nerve deficit or sensory deficit. Coordination normal.  Skin: Skin is warm and dry. No rash noted. She is not diaphoretic.  Psychiatric: She has a normal mood and affect. Her behavior is normal. Thought content normal.   LS spine: [facility] reading (PRIMARY) by  Dr. Audria Nine.  Abnormal appearance of L5-S1, possible normal variant for this patient.  No other bony deformity noted.     Assessment & Plan:  Back spasm - Plan: DG Lumbar Spine Complete, meloxicam (MOBIC) 15 MG tablet, cyclobenzaprine (FLEXERIL) 10 MG tablet; modified duty.  Swelling of foot joint, left - see above.  Other issues partially addressed, given that labs were already drawn this morning.  Additional evaluation defered to a follow-up visit in 4 weeks, when her acute pain is expected to have resolved.    HSV-2 (herpes  simplex virus 2) infection  Vitamin deficiency - Plan: Vitamin D 25 hydroxy  Fatigue - Plan: POCT glucose (manual entry), CBC with Differential, Comprehensive metabolic panel, TSH, POCT urine pregnancy  Urinary urgency - Plan: POCT UA - Microscopic Only, POCT urinalysis dipstick, POCT urine pregnancy  Screening for hyperlipidemia - Plan: Lipid panel  Obesity  Fernande Bras, PA-C Physician Assistant-Certified [facility]

## 2013-04-15 LAB — TSH: TSH: 2.211 u[IU]/mL (ref 0.350–4.500)

## 2013-04-16 ENCOUNTER — Telehealth: Payer: Self-pay

## 2013-04-16 MED ORDER — ERGOCALCIFEROL 1.25 MG (50000 UT) PO CAPS: 50000.0000 [IU] | ORAL_CAPSULE | ORAL | Status: DC

## 2013-04-16 NOTE — Addendum Note (Signed)
Addended by: Fernande Bras on: 04/16/2013 05:21 PM   Modules accepted: Orders

## 2013-04-18 MED ORDER — VITAMIN D 1000 UNITS PO CAPS: 1000.0000 [IU] | ORAL_CAPSULE | Freq: Every day | ORAL | Status: DC

## 2013-04-18 NOTE — Telephone Encounter (Signed)
Sent in for her

## 2013-05-12 ENCOUNTER — Ambulatory Visit: Payer: BC Managed Care – PPO | Admitting: Physician Assistant

## 2013-08-23 ENCOUNTER — Other Ambulatory Visit: Payer: Self-pay | Admitting: Obstetrics and Gynecology

## 2013-08-23 DIAGNOSIS — N644 Mastodynia: Secondary | ICD-10-CM

## 2013-09-02 ENCOUNTER — Ambulatory Visit
Admission: RE | Admit: 2013-09-02 | Discharge: 2013-09-02 | Disposition: A | Discharge status: Home / Self Care | Payer: BC Managed Care – PPO | Source: Ambulatory Visit | Attending: Obstetrics and Gynecology | Admitting: Obstetrics and Gynecology

## 2013-09-02 DIAGNOSIS — N644 Mastodynia: Secondary | ICD-10-CM

## 2013-10-06 ENCOUNTER — Other Ambulatory Visit: Payer: Self-pay

## 2013-10-13 DIAGNOSIS — F509 Eating disorder, unspecified: Secondary | ICD-10-CM | POA: Insufficient documentation

## 2013-10-13 DIAGNOSIS — E668 Other obesity: Secondary | ICD-10-CM | POA: Insufficient documentation

## 2013-10-19 ENCOUNTER — Ambulatory Visit: Payer: BC Managed Care – PPO | Admitting: *Deleted

## 2013-11-01 ENCOUNTER — Ambulatory Visit: Payer: BC Managed Care – PPO | Admitting: *Deleted

## 2014-01-13 ENCOUNTER — Ambulatory Visit (INDEPENDENT_AMBULATORY_CARE_PROVIDER_SITE_OTHER): Payer: BC Managed Care – PPO | Admitting: Family Medicine

## 2014-01-13 VITALS — BP 132/74 | HR 95 | Temp 98.7°F | Resp 16 | Ht 70.0 in | Wt 318.8 lb

## 2014-01-13 DIAGNOSIS — J309 Allergic rhinitis, unspecified: Secondary | ICD-10-CM

## 2014-01-13 DIAGNOSIS — N926 Irregular menstruation, unspecified: Secondary | ICD-10-CM

## 2014-01-13 DIAGNOSIS — R21 Rash and other nonspecific skin eruption: Secondary | ICD-10-CM

## 2014-01-13 DIAGNOSIS — N644 Mastodynia: Secondary | ICD-10-CM

## 2014-01-13 LAB — POCT SKIN KOH: Skin KOH, POC: NEGATIVE

## 2014-01-13 LAB — POCT URINE PREGNANCY: PREG TEST UR: NEGATIVE

## 2014-01-13 MED ORDER — NYSTATIN-TRIAMCINOLONE 100000-0.1 UNIT/GM-% EX OINT: 1.0000 "application " | TOPICAL_OINTMENT | Freq: Two times a day (BID) | CUTANEOUS | Status: DC

## 2014-01-13 NOTE — Progress Notes (Addendum)
Subjective:    Patient ID: Lauren Daniels, female    DOB: 01-19-1976, 38 y.o.   MRN: [id number] This chart was scribed for Wardell Honour, MD by Anastasia Pall, ED Scribe. This patient was seen in room 01 and the patient's care was started at 6:57 PM.  Chief Complaint  Patient presents with  . Rash  . Pregnancy Test   HPI Lauren Struve is a 38 y.o. female Pt presents with dry, itching rash over the left side of her neck, onset 1 week ago. She denies rash on her chest and anywhere else. She denies wearing necklaces, new soaps, detergents. She reports holding someone else's baby within the last week, being worried rash was from that. She reports going to the beach, wondering if rash is from pillows.   She reports thinking she has sinus infection. She reports h/o sinus infections. She reports nasal congestion, intermittent cough, and rhinorrhea, with yellow discharge. She denies fever, chills, sweats, headaches, sinus pressure, cough. She denies having taken Claritin, Nasanex for 1 month due to financial issues. She reports she is now able to obtain those medications.   She reports her LNMP was 1.5 weeks ago. She reports it was shorter than usual. She denies being on birth control.  Requesting pregnancy test.  PCP - JEFFERY,CHELLE, PA-C  Patient Active Problem List   Diagnosis Date Noted  . Back spasm 04/14/2013  . Fibroid   . Vitamin D deficiency   . HSV-2 (herpes simplex virus 2) infection    Past Medical History  Diagnosis Date  . Fibroid   . Vitamin deficiency     HX OF LOW VIT D   . HSV-2 (herpes simplex virus 2) infection   . Allergy   . Back spasm    Past Surgical History  Procedure Laterality Date  . Tumor removal      fibroid   Allergies  Allergen Reactions  . Eye Drops [Naphazoline-Polyethyl Glycol]    Prior to Admission medications   Medication Sig Start Date End Date Taking? Authorizing Provider  Cholecalciferol (VITAMIN D) 1000 UNITS capsule Take 1 capsule  (1,000 Units total) by mouth daily. 04/18/13  Yes Eleanore Kurtis Bushman, PA-C  cyclobenzaprine (FLEXERIL) 10 MG tablet Take 1 tablet (10 mg total) by mouth 3 (three) times daily as needed for muscle spasms. 04/14/13  Yes Chelle S Jeffery, PA-C  ergocalciferol (VITAMIN D2) 50000 UNITS capsule Take 1 capsule (50,000 Units total) by mouth once a week. 04/16/13  Yes Chelle S Jeffery, PA-C  loratadine (CLARITIN) 10 MG tablet Take 10 mg by mouth as needed for allergies.   Yes Historical Provider, MD  meloxicam (MOBIC) 15 MG tablet Take 1 tablet (15 mg total) by mouth daily. 04/14/13  Yes Chelle S Jeffery, PA-C  mometasone (NASONEX) 50 MCG/ACT nasal spray Place 2 sprays into the nose daily.   Yes Historical Provider, MD  valACYclovir (VALTREX) 500 MG tablet Take 500 mg by mouth as needed. BID x 3 days, prn outbreak 04/01/13  Yes Historical Provider, MD   Review of Systems  Constitutional: Negative for fever, chills and diaphoresis.  HENT: Positive for congestion (nasal), rhinorrhea and sneezing. Negative for ear pain, postnasal drip, sinus pressure and sore throat.   Respiratory: Positive for cough. Negative for shortness of breath.   Genitourinary: Positive for menstrual problem.  Skin: Positive for rash (left neck).  Neurological: Negative for headaches.  Hematological: Negative for adenopathy.      Objective:   Physical Exam  Constitutional: She  is oriented to person, place, and time. She appears well-developed and well-nourished. No distress.  HENT:  Head: Normocephalic and atraumatic.  Right Ear: Tympanic membrane, external ear and ear canal normal.  Left Ear: Tympanic membrane, external ear and ear canal normal.  Nose: Mucosal edema present. Right sinus exhibits no maxillary sinus tenderness and no frontal sinus tenderness. Left sinus exhibits no maxillary sinus tenderness and no frontal sinus tenderness.  Mouth/Throat: Uvula is midline, oropharynx is clear and moist and mucous membranes are normal. No  oropharyngeal exudate.  Purulent drainage.   Eyes: EOM are normal.  Neck: Neck supple.  Cardiovascular: Normal rate, regular rhythm and normal heart sounds.   No murmur heard. Pulmonary/Chest: Effort normal and breath sounds normal. No respiratory distress. She has no wheezes. She has no rales.  Musculoskeletal: Normal range of motion.  Neurological: She is alert and oriented to person, place, and time.  Skin: Skin is warm and dry. Rash noted.  Maculopapular rash neck.  Psychiatric: She has a normal mood and affect. Her behavior is normal.  Nursing note and vitals reviewed.  BP 132/74  Pulse 95  Temp(Src) 98.7 F (37.1 C) (Oral)  Resp 16  Ht 5\' 10"  (1.778 m)  Wt 318 lb 12.8 oz (144.607 kg)  BMI 45.74 kg/m2  SpO2 99%  LMP 01/06/2014  Results for orders placed or performed in visit on 01/13/14  POCT urine pregnancy  Result Value Ref Range   Preg Test, Ur Negative   POCT Skin KOH  Result Value Ref Range   Skin KOH, POC Negative        Assessment & Plan:   1. Abnormal menses   2. Breast tenderness   3. Rash   4. Allergic rhinitis, cause unspecified     1. Abnormal menses: New.  Negative pregnancy test.  If menses remain irregular, recommend repeating pregnancy test in one month. 2.  Breast tenderness:  New.  Recommend limiting caffeine intake. 3.  Rash/contact dermatitis: New. Recommend Dove soap for sensitive skin; rx for Mycolog cream provided. 4.  Allergic Rhinitis: Uncontrolled; recommend restarting oral antihistamine and nasal steroid as previously prescribed.   Meds ordered this encounter  Medications  . nystatin-triamcinolone ointment (MYCOLOG)    Sig: Apply 1 application topically 2 (two) times daily.    Dispense:  30 g    Refill:  0      I personally performed the services described in this documentation, which was scribed in my presence.  The recorded information has been reviewed and is accurate.  Reginia Forts, M.D.  [facility] [address] [place], [place]  [address] [phone] phone [phone] fax

## 2014-01-19 ENCOUNTER — Other Ambulatory Visit: Payer: Self-pay | Admitting: Orthopedic Surgery

## 2014-01-19 DIAGNOSIS — M25562 Pain in left knee: Secondary | ICD-10-CM

## 2014-01-20 ENCOUNTER — Ambulatory Visit
Admission: RE | Admit: 2014-01-20 | Discharge: 2014-01-20 | Disposition: A | Discharge status: Home / Self Care | Payer: BC Managed Care – PPO | Source: Ambulatory Visit | Attending: Orthopedic Surgery | Admitting: Orthopedic Surgery

## 2014-01-20 DIAGNOSIS — M25562 Pain in left knee: Secondary | ICD-10-CM

## 2014-06-20 ENCOUNTER — Other Ambulatory Visit: Payer: Self-pay | Admitting: Obstetrics and Gynecology

## 2014-06-20 DIAGNOSIS — Z803 Family history of malignant neoplasm of breast: Secondary | ICD-10-CM

## 2014-06-25 ENCOUNTER — Ambulatory Visit
Admission: RE | Admit: 2014-06-25 | Discharge: 2014-06-25 | Disposition: A | Discharge status: Home / Self Care | Payer: BC Managed Care – PPO | Source: Ambulatory Visit | Attending: Obstetrics and Gynecology | Admitting: Obstetrics and Gynecology

## 2014-06-25 DIAGNOSIS — Z803 Family history of malignant neoplasm of breast: Secondary | ICD-10-CM

## 2014-06-27 ENCOUNTER — Ambulatory Visit
Admission: RE | Admit: 2014-06-27 | Discharge: 2014-06-27 | Disposition: A | Discharge status: Home / Self Care | Payer: BC Managed Care – PPO | Source: Ambulatory Visit | Attending: Obstetrics and Gynecology | Admitting: Obstetrics and Gynecology

## 2014-06-27 MED ORDER — GADOBENATE DIMEGLUMINE 529 MG/ML IV SOLN
20.0000 mL | Freq: Once | INTRAVENOUS | Status: AC | PRN
  Administered 2014-06-27: 20 mL via INTRAVENOUS

## 2014-07-05 ENCOUNTER — Other Ambulatory Visit: Payer: Self-pay | Admitting: Obstetrics and Gynecology

## 2014-07-05 DIAGNOSIS — N6320 Unspecified lump in the left breast, unspecified quadrant: Secondary | ICD-10-CM

## 2014-07-06 ENCOUNTER — Other Ambulatory Visit: Payer: Self-pay | Admitting: Obstetrics and Gynecology

## 2014-07-06 DIAGNOSIS — N6320 Unspecified lump in the left breast, unspecified quadrant: Secondary | ICD-10-CM

## 2014-07-12 ENCOUNTER — Other Ambulatory Visit: Payer: Self-pay | Admitting: Obstetrics and Gynecology

## 2014-07-12 ENCOUNTER — Ambulatory Visit
Admission: RE | Admit: 2014-07-12 | Discharge: 2014-07-12 | Disposition: A | Discharge status: Home / Self Care | Payer: BC Managed Care – PPO | Source: Ambulatory Visit | Attending: Obstetrics and Gynecology | Admitting: Obstetrics and Gynecology

## 2014-07-12 ENCOUNTER — Other Ambulatory Visit: Payer: BC Managed Care – PPO

## 2014-07-12 DIAGNOSIS — N6320 Unspecified lump in the left breast, unspecified quadrant: Secondary | ICD-10-CM

## 2014-07-12 DIAGNOSIS — R928 Other abnormal and inconclusive findings on diagnostic imaging of breast: Secondary | ICD-10-CM

## 2014-07-19 ENCOUNTER — Other Ambulatory Visit: Payer: Self-pay | Admitting: Obstetrics and Gynecology

## 2014-07-19 DIAGNOSIS — R928 Other abnormal and inconclusive findings on diagnostic imaging of breast: Secondary | ICD-10-CM

## 2014-07-27 ENCOUNTER — Inpatient Hospital Stay: Admission: RE | Admit: 2014-07-27 | Payer: BC Managed Care – PPO | Source: Ambulatory Visit

## 2014-10-13 ENCOUNTER — Ambulatory Visit
Admission: RE | Admit: 2014-10-13 | Discharge: 2014-10-13 | Disposition: A | Discharge status: Home / Self Care | Payer: BC Managed Care – PPO | Source: Ambulatory Visit | Attending: Physician Assistant | Admitting: Physician Assistant

## 2014-10-13 ENCOUNTER — Ambulatory Visit (INDEPENDENT_AMBULATORY_CARE_PROVIDER_SITE_OTHER): Payer: BC Managed Care – PPO | Admitting: Physician Assistant

## 2014-10-13 ENCOUNTER — Telehealth: Payer: Self-pay

## 2014-10-13 VITALS — BP 122/78 | HR 81 | Temp 98.6°F | Resp 16 | Ht 70.0 in | Wt 325.0 lb

## 2014-10-13 DIAGNOSIS — R519 Headache, unspecified: Secondary | ICD-10-CM

## 2014-10-13 DIAGNOSIS — R51 Headache: Secondary | ICD-10-CM

## 2014-10-13 MED ORDER — TOPIRAMATE 50 MG PO TABS: 50.0000 mg | ORAL_TABLET | Freq: Every day | ORAL | Status: DC

## 2014-10-13 MED ORDER — PROMETHAZINE HCL 25 MG/ML IJ SOLN
25.0000 mg | Freq: Once | INTRAMUSCULAR | Status: AC
  Administered 2014-10-13: 25 mg via INTRAMUSCULAR

## 2014-10-13 MED ORDER — KETOROLAC TROMETHAMINE 30 MG/ML IJ SOLN
30.0000 mg | Freq: Once | INTRAMUSCULAR | Status: AC
  Administered 2014-10-13: 30 mg via INTRAMUSCULAR

## 2014-10-13 NOTE — Telephone Encounter (Signed)
Authorization #[id number]

## 2014-10-13 NOTE — Patient Instructions (Signed)
Please report to [facility] Your appointment time is 1:50pm.

## 2014-10-13 NOTE — Telephone Encounter (Signed)
Insurance [employer] needs peer to peer: phone number: [phone] option 2. Insurance says the information I gave does not meet criteria.  Member ID: [id number] Group: [id number] Cpt code: [phone]

## 2014-10-13 NOTE — Progress Notes (Signed)
Subjective:    Patient ID: Lauren Daniels, female    DOB: 12-25-1975, 38 y.o.   MRN: [id number]   PCP: Ayomide Purdy, PA-C  Chief Complaint  Patient presents with  . Headache    Allergies  Allergen Reactions  . Eye Drops [Naphazoline-Polyethyl Glycol]     Patient Active Problem List   Diagnosis Date Noted  . Appetite disorder 10/13/2013  . Extreme obesity 10/13/2013  . Back spasm 04/14/2013  . Fibroid   . Vitamin D deficiency   . HSV-2 (herpes simplex virus 2) infection     Prior to Admission medications   Medication Sig Start Date End Date Taking? Authorizing Provider  Iron Polysacch Cmplx-B12-FA (FERREX 150 FORTE) 150-0.025-1 MG CAPS Take 1 capsule by mouth. 10/13/13  Yes Historical Provider, MD  loratadine (CLARITIN) 10 MG tablet Take 10 mg by mouth as needed for allergies.   Yes Historical Provider, MD  mometasone (NASONEX) 50 MCG/ACT nasal spray Place 2 sprays into the nose daily.   Yes Historical Provider, MD  valACYclovir (VALTREX) 500 MG tablet Take 500 mg by mouth as needed. BID x 3 days, prn outbreak 04/01/13  Yes Historical Provider, MD  Cholecalciferol (VITAMIN D) 1000 UNITS capsule Take 1 capsule (1,000 Units total) by mouth daily. 04/18/13   Eleanore Kurtis Bushman, PA-C  cyclobenzaprine (FLEXERIL) 10 MG tablet Take 1 tablet (10 mg total) by mouth 3 (three) times daily as needed for muscle spasms. 04/14/13   Fara Chute, PA-C    Medical, Surgical, Family and Social History reviewed and updated.  HPI  This 38 y.o. female presents for evaluation of headache.  Headaches began 3 months ago. No previous history of headache.  Her 25 year old son has migraine. Associated with photophobia, phonophobia, nausea/vomiting. Last all day. 10/10. Friends have given her "headache pills" but none have helped. Only sleep helps. When she wakes up, it "takes like an hour" to fall back asleep due to the pain. No slurred speech, weakness, loss of visual acuity, confusion.  "Getting  in trouble at work" for missing shifts, leaving early, being generally irritable.  This headache began yesterday. 10/10. Lasting longer than other headaches.  Review of Systems     Objective:   Physical Exam  Constitutional: She is oriented to person, place, and time. She appears well-developed and well-nourished. She is active and cooperative. No distress.  BP 122/78 mmHg  Pulse 81  Temp(Src) 98.6 F (37 C) (Oral)  Resp 16  Ht 5\' 10"  (1.778 m)  Wt 325 lb (147.419 kg)  BMI 46.63 kg/m2  SpO2 100%  LMP 09/19/2014 Seating in a dark room, rocking, holding her head and crying.  Periodically gagging, gastric juices.   HENT:  Head: Normocephalic and atraumatic.  Right Ear: Hearing, tympanic membrane, external ear and ear canal normal.  Left Ear: Hearing, tympanic membrane, external ear and ear canal normal.  Nose: Nose normal.  Mouth/Throat: Uvula is midline, oropharynx is clear and moist and mucous membranes are normal.  Eyes: Conjunctivae, EOM and lids are normal. Pupils are equal, round, and reactive to light. No scleral icterus.  Fundoscopic exam:      The right eye shows no exudate, no hemorrhage and no papilledema. The right eye shows red reflex.       The left eye shows no exudate, no hemorrhage and no papilledema. The left eye shows red reflex.  Neck: Normal range of motion and full passive range of motion without pain. Neck supple. No thyroid mass and no  thyromegaly present.  Cardiovascular: Normal rate, regular rhythm, normal heart sounds and normal pulses.   Pulmonary/Chest: Effort normal and breath sounds normal.  Musculoskeletal:  FROM without pain.  Neurological: She is alert and oriented to person, place, and time. She has normal strength. No cranial nerve deficit or sensory deficit. Coordination and gait normal. GCS eye subscore is 4. GCS verbal subscore is 5. GCS motor subscore is 6.  Reflex Scores:      Bicep reflexes are 2+ on the right side and 2+ on the left  side.      Patellar reflexes are 2+ on the right side and 2+ on the left side.      Achilles reflexes are 2+ on the right side and 2+ on the left side. No ankle clonus.  Skin: Skin is warm, dry and intact.  Psychiatric: She has a normal mood and affect. Her speech is normal and behavior is normal. Judgment and thought content normal. Cognition and memory are normal.          Assessment & Plan:  1. Acute nonintractable headache, unspecified headache type If CT negative, plan initiation of topiramate until she can get in with neurology. Encouraged her to talk with HR about FMLA. - CT Head Wo Contrast; Future - Ambulatory referral to Neurology - ketorolac (TORADOL) 30 MG/ML injection 30 mg; Inject 1 mL (30 mg total) into the muscle once. - promethazine (PHENERGAN) injection 25 mg; Inject 1 mL (25 mg total) into the muscle once.   Fara Chute, PA-C Physician Assistant-Certified [facility]

## 2014-10-13 NOTE — Telephone Encounter (Signed)
Thank you, I have place auth number with patients referral.

## 2014-10-19 ENCOUNTER — Telehealth: Payer: Self-pay | Admitting: Neurology

## 2014-10-19 ENCOUNTER — Ambulatory Visit (INDEPENDENT_AMBULATORY_CARE_PROVIDER_SITE_OTHER): Payer: BC Managed Care – PPO | Admitting: Neurology

## 2014-10-19 ENCOUNTER — Encounter: Payer: Self-pay | Admitting: Neurology

## 2014-10-19 VITALS — BP 135/86 | HR 80 | Temp 97.6°F | Resp 14 | Ht 70.5 in | Wt 329.0 lb

## 2014-10-19 DIAGNOSIS — G43719 Chronic migraine without aura, intractable, without status migrainosus: Secondary | ICD-10-CM

## 2014-10-19 DIAGNOSIS — G43909 Migraine, unspecified, not intractable, without status migrainosus: Secondary | ICD-10-CM

## 2014-10-19 HISTORY — DX: Migraine, unspecified, not intractable, without status migrainosus: G43.909

## 2014-10-19 MED ORDER — TOPIRAMATE 50 MG PO TABS: 100.0000 mg | ORAL_TABLET | Freq: Every day | ORAL | Status: DC

## 2014-10-19 MED ORDER — PREDNISONE 5 MG PO TABS: ORAL_TABLET | ORAL | Status: DC

## 2014-10-19 MED ORDER — SUMATRIPTAN SUCCINATE 100 MG PO TABS: 100.0000 mg | ORAL_TABLET | Freq: Two times a day (BID) | ORAL | Status: DC

## 2014-10-19 NOTE — Telephone Encounter (Signed)
Benjamine Mola from [facility] calling to get clarification on patient's Imitrex medication directions, please return call and advise.

## 2014-10-19 NOTE — Telephone Encounter (Signed)
I called back.  Spoke with Benjamine Mola.  Verified Rx.

## 2014-10-19 NOTE — Progress Notes (Signed)
Reason for visit: Migraine headache  Lauren Daniels is a 38 y.o. female  History of present illness:  Lauren Daniels is a 38 year old black female with a history of migraine headache that began approximately 3 months ago. The patient indicates that prior to that her headaches were quite infrequent. The patient indicates that she began having headaches once a week, but more recently, the headaches are occurring 2 or 3 times a week, and may last up to 2 days at a time. The headaches are bitemporal in nature associated with throbbing sensation. The patient will have nausea and vomiting, and associated photophobia and phonophobia. The patient is missing work because of the headache. She will have occasional dizziness and vertigo associated with the headaches as well. She reports no numbness or tingling sensations or weakness of the extremities. She denies any neck stiffness associated with the headache. She indicates that sleep may help the headache. She does not know of any particular activating factors for the headache. She was recently seen by her primary care physician, and she was placed on Topamax, currently on 50 mg daily. An injection of Toradol seemed to help some. The patient comes to this office for further evaluation. A CT scan of the brain was done, and this is unremarkable.  Past Medical History  Diagnosis Date  . Fibroid   . Vitamin deficiency     HX OF LOW VIT D   . HSV-2 (herpes simplex virus 2) infection   . Allergy   . Back spasm   . Migraine 10/19/2014  . Obesity     Past Surgical History  Procedure Laterality Date  . Tumor removal      fibroid    Family History  Problem Relation Age of Onset  . Breast cancer Mother 47  . Breast cancer Maternal Grandmother 15  . Colon cancer Maternal Grandmother   . Diabetes Father   . Heart disease Father   . Breast cancer Maternal Aunt   . Breast cancer Maternal Aunt   . Migraines Son   . Migraines Brother     Social  history:  reports that she has never smoked. She has never used smokeless tobacco. She reports that she does not drink alcohol or use illicit drugs.  Medications:  Current Outpatient Prescriptions on File Prior to Visit  Medication Sig Dispense Refill  . Cholecalciferol (VITAMIN D) 1000 UNITS capsule Take 1 capsule (1,000 Units total) by mouth daily. 30 capsule 11  . Iron Polysacch Cmplx-B12-FA (FERREX 150 FORTE) 150-0.025-1 MG CAPS Take 1 capsule by mouth.    . loratadine (CLARITIN) 10 MG tablet Take 10 mg by mouth as needed for allergies.    . mometasone (NASONEX) 50 MCG/ACT nasal spray Place 2 sprays into the nose daily.    . cyclobenzaprine (FLEXERIL) 10 MG tablet Take 1 tablet (10 mg total) by mouth 3 (three) times daily as needed for muscle spasms. 30 tablet 0  . valACYclovir (VALTREX) 500 MG tablet Take 500 mg by mouth as needed. BID x 3 days, prn outbreak     No current facility-administered medications on file prior to visit.      Allergies  Allergen Reactions  . Eye Drops [Naphazoline-Polyethyl Glycol]     ROS:  Out of a complete 14 system review of symptoms, the patient complains only of the following symptoms, and all other reviewed systems are negative.  Fatigue Blurred vision Headache, dizziness Depression, not enough sleep Insomnia  Blood pressure 135/86, pulse 80,  temperature 97.6 F (36.4 C), temperature source Oral, resp. rate 14, height 5' 10.5" (1.791 m), weight 329 lb (149.233 kg), last menstrual period 09/19/2014.  Physical Exam  General: The patient is alert and cooperative at the time of the examination. The patient is markedly obese.  Eyes: Pupils are equal, round, and reactive to light. Discs are flat bilaterally.  Neck: The neck is supple, no carotid bruits are noted.  Respiratory: The respiratory examination is clear.  Cardiovascular: The cardiovascular examination reveals a regular rate and rhythm, no obvious murmurs or rubs are  noted.  Neuromuscular: Range of movement of the cervical spine is full. No crepitus is noted over the temporomandibular joints.  Skin: Extremities are without significant edema.  Neurologic Exam  Mental status: The patient is alert and oriented x 3 at the time of the examination. The patient has apparent normal recent and remote memory, with an apparently normal attention span and concentration ability.  Cranial nerves: Facial symmetry is present. There is good sensation of the face to pinprick and soft touch bilaterally. The strength of the facial muscles and the muscles to head turning and shoulder shrug are normal bilaterally. Speech is well enunciated, no aphasia or dysarthria is noted. Extraocular movements are full. Visual fields are full. The tongue is midline, and the patient has symmetric elevation of the soft palate. No obvious hearing deficits are noted.  Motor: The motor testing reveals 5 over 5 strength of all 4 extremities. Good symmetric motor tone is noted throughout.  Sensory: Sensory testing is intact to pinprick, soft touch, vibration sensation, and position sense on all 4 extremities. No evidence of extinction is noted.  Coordination: Cerebellar testing reveals good finger-nose-finger and heel-to-shin bilaterally.  Gait and station: Gait is normal. Tandem gait is normal. Romberg is negative. No drift is seen.  Reflexes: Deep tendon reflexes are symmetric and normal bilaterally. Toes are downgoing bilaterally.   CT head 10/13/14:  IMPRESSION: No acute intracranial finding.    Assessment/Plan:  1. Migraine headache  The patient is having common migraine. She has had an exacerbation in her headaches recently over the last 3 months. She has been placed on Topamax, and she will increase the dose to 100 mg at night within the next 1 week. She was given a prescription for Imitrex tablets, and she will be placed on a prednisone Dosepak. She will follow-up in about 3  months. If the patient is not doing well on the medication regimen, she is to contact our office.  Jill Alexanders MD 10/19/2014 12:26 PM  [facility] [address] [place],  [address]  Phone [phone] Fax [phone]

## 2014-10-19 NOTE — Patient Instructions (Signed)

## 2014-10-19 NOTE — Telephone Encounter (Signed)
This patient did not show for a NP appointment today. 

## 2014-10-24 DIAGNOSIS — Z029 Encounter for administrative examinations, unspecified: Secondary | ICD-10-CM

## 2014-10-30 ENCOUNTER — Encounter: Payer: Self-pay | Admitting: Physician Assistant

## 2014-10-30 ENCOUNTER — Telehealth: Payer: Self-pay | Admitting: Physician Assistant

## 2014-10-30 NOTE — Telephone Encounter (Signed)
Please clarify with this patient the diagnosis for her time away from work.  I think it is MIGRAINE Headache.  She may need to have Dr. Jannifer Franklin complete this form if that is the case.

## 2014-10-30 NOTE — Telephone Encounter (Signed)
Patient dropped off FMLA forms on 10/24/2014 and they were placed in [facility] box on 10/30/2014. On form patient indicates that the FMLA is for an illness. I called patient to confirm and there was no answer from phone number #1 and phone number #2's VM box was full. Awaiting completion within 5-7 business days.  Thanks, [employer]

## 2014-11-08 NOTE — Telephone Encounter (Signed)
Please contact this patient. See my previous message. I still have the forms in by box.

## 2014-11-13 NOTE — Telephone Encounter (Signed)
Pt states that her phone has been off; this is why we have not been able to reach her. She indicated that the FMLA ppw is for the Migraine headache, should I advise her to have this done by Dr. Jannifer Franklin?

## 2014-11-13 NOTE — Telephone Encounter (Signed)
Since Dr. Jannifer Franklin is now managing her headaches, he should do the FMLA papers.

## 2014-11-20 ENCOUNTER — Inpatient Hospital Stay: Admission: RE | Admit: 2014-11-20 | Payer: BC Managed Care – PPO | Source: Ambulatory Visit

## 2014-11-27 NOTE — Telephone Encounter (Signed)
Have not been able to reach Pt at either number provided, and no working VM. Called [facility], and left a message with them to have Pt complete FMLA w/ Dr. Jannifer Franklin at [facility]

## 2014-12-01 ENCOUNTER — Ambulatory Visit (INDEPENDENT_AMBULATORY_CARE_PROVIDER_SITE_OTHER): Payer: BLUE CROSS/BLUE SHIELD | Admitting: Physician Assistant

## 2014-12-01 VITALS — BP 128/82 | HR 72 | Temp 97.8°F | Resp 16 | Ht 70.0 in | Wt 325.6 lb

## 2014-12-01 DIAGNOSIS — J069 Acute upper respiratory infection, unspecified: Secondary | ICD-10-CM

## 2014-12-01 DIAGNOSIS — R5383 Other fatigue: Secondary | ICD-10-CM

## 2014-12-01 DIAGNOSIS — B9789 Other viral agents as the cause of diseases classified elsewhere: Secondary | ICD-10-CM

## 2014-12-01 DIAGNOSIS — R928 Other abnormal and inconclusive findings on diagnostic imaging of breast: Secondary | ICD-10-CM | POA: Insufficient documentation

## 2014-12-01 LAB — POCT CBC
Granulocyte percent: 41.2 %G (ref 37–80)
HCT, POC: 40.3 % (ref 37.7–47.9)
Hemoglobin: 12.4 g/dL (ref 12.2–16.2)
Lymph, poc: 3.3 (ref 0.6–3.4)
MCH, POC: 25.1 pg — AB (ref 27–31.2)
MCHC: 30.8 g/dL — AB (ref 31.8–35.4)
MCV: 81.5 fL (ref 80–97)
MID (cbc): 0.3 (ref 0–0.9)
MPV: 6.9 fL (ref 0–99.8)
POC GRANULOCYTE: 2.6 (ref 2–6.9)
POC LYMPH PERCENT: 53.5 %L — AB (ref 10–50)
POC MID %: 5.3 % (ref 0–12)
Platelet Count, POC: 340 10*3/uL (ref 142–424)
RBC: 4.94 M/uL (ref 4.04–5.48)
RDW, POC: 17 %
WBC: 6.2 10*3/uL (ref 4.6–10.2)

## 2014-12-01 LAB — POCT GLYCOSYLATED HEMOGLOBIN (HGB A1C): Hemoglobin A1C: 5.6

## 2014-12-01 LAB — POCT UA - MICROSCOPIC ONLY
Bacteria, U Microscopic: NEGATIVE
CASTS, UR, LPF, POC: NEGATIVE
Crystals, Ur, HPF, POC: NEGATIVE
Mucus, UA: NEGATIVE
WBC, Ur, HPF, POC: NEGATIVE
YEAST UA: NEGATIVE

## 2014-12-01 LAB — GLUCOSE, POCT (MANUAL RESULT ENTRY): POC Glucose: 90 mg/dl (ref 70–99)

## 2014-12-01 LAB — POCT URINALYSIS DIPSTICK
Bilirubin, UA: NEGATIVE
Blood, UA: NEGATIVE
Glucose, UA: NEGATIVE
KETONES UA: NEGATIVE
Leukocytes, UA: NEGATIVE
Nitrite, UA: NEGATIVE
PROTEIN UA: NEGATIVE
SPEC GRAV UA: 1.015
Urobilinogen, UA: 0.2
pH, UA: 8.5

## 2014-12-01 LAB — POCT URINE PREGNANCY: Preg Test, Ur: NEGATIVE

## 2014-12-01 MED ORDER — BENZONATATE 100 MG PO CAPS: 100.0000 mg | ORAL_CAPSULE | Freq: Three times a day (TID) | ORAL | Status: DC | PRN

## 2014-12-01 MED ORDER — IPRATROPIUM BROMIDE 0.03 % NA SOLN: 2.0000 | Freq: Two times a day (BID) | NASAL | Status: DC

## 2014-12-01 NOTE — Patient Instructions (Signed)
Continue your regular medications. Get plenty of rest and drink at least 64 ounces of water daily.

## 2014-12-01 NOTE — Progress Notes (Signed)
Subjective:    Patient ID: Lauren Daniels, female    DOB: [birth year], 39 y.o.   MRN: [id number]   PCP: Melinna Linarez, PA-C  Chief Complaint  Patient presents with  . Headache  . Fatigue  . watery eyes  . Sore Throat    Allergies  Allergen Reactions  . Eye Drops [Naphazoline-Polyethyl Glycol]     Patient Active Problem List   Diagnosis Date Noted  . Abnormal mammogram of left breast 12/01/2014  . Migraine 10/19/2014  . Appetite disorder 10/13/2013  . Extreme obesity 10/13/2013  . Back spasm 04/14/2013  . Fibroid   . Vitamin D deficiency   . HSV-2 (herpes simplex virus 2) infection     Prior to Admission medications   Medication Sig Start Date End Date Taking? Authorizing Provider  Cholecalciferol (VITAMIN D) 1000 UNITS capsule Take 1 capsule (1,000 Units total) by mouth daily. 04/18/13  Yes Eleanore Kurtis Bushman, PA-C  cyclobenzaprine (FLEXERIL) 10 MG tablet Take 1 tablet (10 mg total) by mouth 3 (three) times daily as needed for muscle spasms. 04/14/13  Yes Stormy Connon S Keiona Jenison, PA-C  Iron Polysacch Cmplx-B12-FA (FERREX 150 FORTE) 150-0.025-1 MG CAPS Take 1 capsule by mouth. 10/13/13  Yes Historical Provider, MD  loratadine (CLARITIN) 10 MG tablet Take 10 mg by mouth as needed for allergies.   Yes Historical Provider, MD  mometasone (NASONEX) 50 MCG/ACT nasal spray Place 2 sprays into the nose daily.   Yes Historical Provider, MD  SUMAtriptan (IMITREX) 100 MG tablet Take 1 tablet (100 mg total) by mouth 2 (two) times daily. May repeat in 2 hours if headache persists or recurs. 10/19/14  Yes Kathrynn Ducking, MD  topiramate (TOPAMAX) 50 MG tablet Take 2 tablets (100 mg total) by mouth at bedtime. 10/19/14  Yes Kathrynn Ducking, MD  valACYclovir (VALTREX) 500 MG tablet Take 500 mg by mouth as needed. BID x 3 days, prn outbreak 04/01/13  Yes Historical Provider, MD    Medical, Surgical, Family and Social History reviewed and updated.  HPI  Has been feeling bad for about 4  days. Mild headache, constant, nothing like the migraine. Eyes are watery. Feels fatigued. Not resting well. Her fiance recorded her talking in her sleep and reports that she's restless, even sitting up in bed and gesturing. Some dizziness, coughing (non-productive), sneezing. Nasal congestion and drainage, sore throat. No fever, chills, no diarrhea, no vomiting. Nausea. Reduced appetite. Had to leave work early today because she "just didn't feel good."  She's to have an MRI and breast biopsy 3 weeks after her next period.  She missed her last visit, and now has to wait until her next period. LMP 10/24/2014.  Migraines are much improved. She's tolerating Topiramate well. Last visit with neurology was 10/19/2014.  Further discussion reveals that she is pretty stressed at work.  She's been there for about 10 years, so lots of her colleagues rely on her for help/advice. Their current supervisor "is making it difficult" on all of them. She's reported it to his supervisor and received "a really mean e-mail" in reply. "I think I need to find a new job."   Review of Systems    as above. Objective:   Physical Exam  Constitutional: She is oriented to person, place, and time. Vital signs are normal. She appears well-developed and well-nourished. She is active and cooperative. No distress.  HENT:  Head: Normocephalic and atraumatic.  Right Ear: Hearing, tympanic membrane, external ear and ear canal normal.  Left Ear:  Hearing, tympanic membrane, external ear and ear canal normal.  Nose: Mucosal edema (mild) present.  No foreign bodies. Right sinus exhibits no maxillary sinus tenderness and no frontal sinus tenderness. Left sinus exhibits no maxillary sinus tenderness and no frontal sinus tenderness.  Mouth/Throat: Uvula is midline, oropharynx is clear and moist and mucous membranes are normal. No uvula swelling. No oropharyngeal exudate.  Eyes: Conjunctivae, EOM and lids are normal. Pupils are  equal, round, and reactive to light. Right eye exhibits no discharge. Left eye exhibits no discharge. No scleral icterus.  Neck: Trachea normal, normal range of motion and full passive range of motion without pain. Neck supple. No thyroid mass and no thyromegaly present.  Cardiovascular: Normal rate, regular rhythm, normal heart sounds and intact distal pulses.   Pulmonary/Chest: Effort normal and breath sounds normal.  Lymphadenopathy:       Head (right side): No submandibular, no tonsillar, no preauricular, no posterior auricular and no occipital adenopathy present.       Head (left side): No submandibular, no tonsillar, no preauricular and no occipital adenopathy present.    She has no cervical adenopathy.       Right: No supraclavicular adenopathy present.       Left: No supraclavicular adenopathy present.  Neurological: She is alert and oriented to person, place, and time. She has normal strength and normal reflexes. No cranial nerve deficit or sensory deficit. Coordination normal.  Skin: Skin is warm, dry and intact. No rash noted.  Psychiatric: She has a normal mood and affect. Her speech is normal and behavior is normal.  Vitals reviewed.     Results for orders placed or performed in visit on 12/01/14  POCT CBC  Result Value Ref Range   WBC 6.2 4.6 - 10.2 K/uL   Lymph, poc 3.3 0.6 - 3.4   POC LYMPH PERCENT 53.5 (A) 10 - 50 %L   MID (cbc) 0.3 0 - 0.9   POC MID % 5.3 0 - 12 %M   POC Granulocyte 2.6 2 - 6.9   Granulocyte percent 41.2 37 - 80 %G   RBC 4.94 4.04 - 5.48 M/uL   Hemoglobin 12.4 12.2 - 16.2 g/dL   HCT, POC 40.3 37.7 - 47.9 %   MCV 81.5 80 - 97 fL   MCH, POC 25.1 (A) 27 - 31.2 pg   MCHC 30.8 (A) 31.8 - 35.4 g/dL   RDW, POC 17.0 %   Platelet Count, POC 340 142 - 424 K/uL   MPV 6.9 0 - 99.8 fL  POCT glucose (manual entry)  Result Value Ref Range   POC Glucose 90 70 - 99 mg/dl  POCT glycosylated hemoglobin (Hb A1C)  Result Value Ref Range   Hemoglobin A1C 5.6    POCT UA - Microscopic Only  Result Value Ref Range   WBC, Ur, HPF, POC neg    RBC, urine, microscopic 0-1    Bacteria, U Microscopic neg    Mucus, UA neg    Epithelial cells, urine per micros 0-2    Crystals, Ur, HPF, POC neg    Casts, Ur, LPF, POC neg    Yeast, UA neg   POCT urinalysis dipstick  Result Value Ref Range   Color, UA yellow    Clarity, UA clear    Glucose, UA neg    Bilirubin, UA neg    Ketones, UA neg    Spec Grav, UA 1.015    Blood, UA neg    pH, UA 8.5  Protein, UA neg    Urobilinogen, UA 0.2    Nitrite, UA neg    Leukocytes, UA Negative   POCT urine pregnancy  Result Value Ref Range   Preg Test, Ur Negative        Assessment & Plan:  1. Other fatigue Most likely due to stress of work and viral URI and concern for abnormal mammogram. Counseled. Anticipatory guidance. If menses do not start in the next 1-2 weeks, recommend repeating the pregnancy testing, with serum quantitative HCG if urine test is negative. She's had irregular menses before. - POCT CBC - POCT glucose (manual entry) - POCT glycosylated hemoglobin (Hb A1C) - POCT UA - Microscopic Only - POCT urinalysis dipstick - POCT urine pregnancy  2. Viral URI with cough Supportive care, anticipatory guidance. RTC if symptoms worsen/persist. OOW today and tomorrow. OK to extend if needed. - ipratropium (ATROVENT) 0.03 % nasal spray; Place 2 sprays into both nostrils 2 (two) times daily.  Dispense: 30 mL; Refill: 0 - benzonatate (TESSALON) 100 MG capsule; Take 1-2 capsules (100-200 mg total) by mouth 3 (three) times daily as needed for cough.  Dispense: 40 capsule; Refill: 0   Fara Chute, PA-C Physician Assistant-Certified [facility]

## 2015-01-05 ENCOUNTER — Telehealth: Payer: Self-pay | Admitting: Physician Assistant

## 2015-01-05 NOTE — Telephone Encounter (Signed)
Patient states that her FMLA was denied because it was not completed all the way. Patient states that her neurologist is supposed to do her paperwork (Chelle stated this in a previous message). Patient needs a letter to submit to her employer stating that she actually did attempt to have the paperwork filled out, that is was supposed to be completed by the neurologist, the dates that she was seen in our office and the dates that she was out of work. Patient wants to come in and pick this up. Lauren or Delana Meyer will notify patient when it is ready. Place in FMLA tray upon completion.   [phone]

## 2015-01-05 NOTE — Telephone Encounter (Signed)
I've written a letter and routed it to the FMLA/Disability pool.  It documents the information that we have, but I am not sure that it is what she needs.  She did try to have [place] complete the forms. We advised her that the neurologist needed to do it.  Due to her phone being off, it took [place] a month to give her that communication.   I wrote her out of work 11/13-11/14/2015 and 1/01-1/01/2015.

## 2015-01-05 NOTE — Telephone Encounter (Signed)
Chelle can you help patient with this letter.

## 2015-01-30 ENCOUNTER — Ambulatory Visit: Payer: BC Managed Care – PPO | Admitting: Adult Health

## 2015-01-31 ENCOUNTER — Ambulatory Visit (INDEPENDENT_AMBULATORY_CARE_PROVIDER_SITE_OTHER): Payer: BLUE CROSS/BLUE SHIELD | Admitting: Neurology

## 2015-01-31 ENCOUNTER — Encounter: Payer: Self-pay | Admitting: Neurology

## 2015-01-31 VITALS — BP 112/75 | HR 75 | Ht 71.0 in | Wt 324.0 lb

## 2015-01-31 DIAGNOSIS — G43009 Migraine without aura, not intractable, without status migrainosus: Secondary | ICD-10-CM | POA: Diagnosis not present

## 2015-01-31 MED ORDER — SUMATRIPTAN SUCCINATE 100 MG PO TABS: 100.0000 mg | ORAL_TABLET | Freq: Two times a day (BID) | ORAL | Status: DC | PRN

## 2015-01-31 MED ORDER — NORTRIPTYLINE HCL 10 MG PO CAPS: ORAL_CAPSULE | ORAL | Status: DC

## 2015-01-31 NOTE — Progress Notes (Signed)
Reason for visit: Migraine headache  Lauren Daniels is an 39 y.o. female  History of present illness:  Lauren Daniels is a 39 year old black female with a history of intractable migraine headache. The patient has had fewer severe headaches since last seen, but she continues to have headaches every day to a mild degree, with occasional severe headaches that occur on average once a week. The headaches are associated with photophobia, phonophobia, nausea and vomiting. The patient feels fatigued throughout the day. She indicates that she will talk in her sleep, and she may be sleepwalking as well. She is not sure whether or not she snores at night, but she feels fatigued even when she wakes up in the morning. She will occasionally take a Goody powder for her headache. She was given a prescription for Imitrex tablets, but she never got the prescription filled. She believes that the Topamax is well tolerated, and it has helped the headache some. She returns for an evaluation. She is still missing an occasional day from work. She cannot think or concentrate well during the headache.  Past Medical History  Diagnosis Date  . Fibroid   . Vitamin deficiency     HX OF LOW VIT D   . HSV-2 (herpes simplex virus 2) infection   . Allergy   . Back spasm   . Migraine 10/19/2014  . Obesity     Past Surgical History  Procedure Laterality Date  . Tumor removal      fibroid    Family History  Problem Relation Age of Onset  . Breast cancer Mother 47  . Breast cancer Maternal Grandmother 56  . Colon cancer Maternal Grandmother   . Diabetes Father   . Heart disease Father   . Breast cancer Maternal Aunt   . Breast cancer Maternal Aunt   . Migraines Son   . Migraines Brother     Social history:  reports that she has never smoked. She has never used smokeless tobacco. She reports that she does not drink alcohol or use illicit drugs.    Allergies  Allergen Reactions  . Eye Drops  [Naphazoline-Polyethyl Glycol]     Medications:  Prior to Admission medications   Medication Sig Start Date End Date Taking? Authorizing Provider  Cholecalciferol (VITAMIN D) 1000 UNITS capsule Take 1 capsule (1,000 Units total) by mouth daily. 04/18/13  Yes Eleanore Kurtis Bushman, PA-C  cyclobenzaprine (FLEXERIL) 10 MG tablet Take 1 tablet (10 mg total) by mouth 3 (three) times daily as needed for muscle spasms. 04/14/13  Yes Chelle S Jeffery, PA-C  ipratropium (ATROVENT) 0.03 % nasal spray Place 2 sprays into both nostrils 2 (two) times daily. 12/01/14  Yes Chelle S Jeffery, PA-C  Iron Polysacch Cmplx-B12-FA (FERREX 150 FORTE) 150-0.025-1 MG CAPS Take 1 capsule by mouth. 10/13/13  Yes Historical Provider, MD  loratadine (CLARITIN) 10 MG tablet Take 10 mg by mouth as needed for allergies.   Yes Historical Provider, MD  mometasone (NASONEX) 50 MCG/ACT nasal spray Place 2 sprays into the nose daily.   Yes Historical Provider, MD  topiramate (TOPAMAX) 50 MG tablet Take 2 tablets (100 mg total) by mouth at bedtime. 10/19/14  Yes Kathrynn Ducking, MD  valACYclovir (VALTREX) 500 MG tablet Take 500 mg by mouth as needed. BID x 3 days, prn outbreak 04/01/13  Yes Historical Provider, MD    ROS:  Out of a complete 14 system review of symptoms, the patient complains only of the following symptoms, and all  other reviewed systems are negative.  Headache Nausea Concentration difficulties  Blood pressure 112/75, pulse 75, height 5\' 11"  (1.803 m), weight 324 lb (146.965 kg).  Physical Exam  General: The patient is alert and cooperative at the time of the examination. The patient is markedly obese.  Skin: No significant peripheral edema is noted.   Neurologic Exam  Mental status: The patient is oriented x 3.  Cranial nerves: Facial symmetry is present. Speech is normal, no aphasia or dysarthria is noted. Extraocular movements are full. Visual fields are full. Pupils are equal, round, and reactive to light.  Discs are flat bilaterally.  Motor: The patient has good strength in all 4 extremities.  Sensory examination: Soft touch sensation is symmetric on the face, arms, and legs.   Coordination: The patient has good finger-nose-finger and heel-to-shin bilaterally.  Gait and station: The patient has a normal gait. Tandem gait is normal. Romberg is negative. No drift is seen.  Reflexes: Deep tendon reflexes are symmetric.   Assessment/Plan:  1. Intractable migraine  The patient continues to have significant issues with her headache. We will add nortriptyline at night to help her with her sleep issues and with the headache. The patient will be placed back on Imitrex. She will continue the Topamax at 100 mg at night. She will follow-up in 4 months. She is to contact our office if she is not doing well.    Jill Alexanders MD 01/31/2015 9:30 PM  [facility] [address] [place], [place] [address]  Phone [phone] Fax [phone]

## 2015-01-31 NOTE — Patient Instructions (Signed)

## 2015-02-01 ENCOUNTER — Ambulatory Visit: Payer: BLUE CROSS/BLUE SHIELD | Admitting: Adult Health

## 2015-02-01 ENCOUNTER — Encounter: Payer: Self-pay | Admitting: Adult Health

## 2015-06-13 ENCOUNTER — Telehealth: Payer: Self-pay

## 2015-06-13 ENCOUNTER — Ambulatory Visit: Payer: BLUE CROSS/BLUE SHIELD | Admitting: Adult Health

## 2015-06-13 NOTE — Telephone Encounter (Signed)
.   No showed apt. Today.

## 2015-06-14 ENCOUNTER — Encounter: Payer: Self-pay | Admitting: Adult Health

## 2015-07-24 ENCOUNTER — Other Ambulatory Visit: Payer: Self-pay | Admitting: Obstetrics and Gynecology

## 2015-07-26 ENCOUNTER — Other Ambulatory Visit: Payer: Self-pay | Admitting: Obstetrics and Gynecology

## 2015-07-26 DIAGNOSIS — N6320 Unspecified lump in the left breast, unspecified quadrant: Secondary | ICD-10-CM

## 2015-08-16 ENCOUNTER — Other Ambulatory Visit: Payer: Self-pay

## 2015-09-07 ENCOUNTER — Ambulatory Visit
Admission: RE | Admit: 2015-09-07 | Discharge: 2015-09-07 | Disposition: A | Discharge status: Home / Self Care | Payer: BLUE CROSS/BLUE SHIELD | Source: Ambulatory Visit | Attending: Obstetrics and Gynecology | Admitting: Obstetrics and Gynecology

## 2015-09-07 DIAGNOSIS — N6320 Unspecified lump in the left breast, unspecified quadrant: Secondary | ICD-10-CM

## 2015-10-06 ENCOUNTER — Other Ambulatory Visit: Payer: Self-pay | Admitting: Obstetrics and Gynecology

## 2015-10-06 DIAGNOSIS — R928 Other abnormal and inconclusive findings on diagnostic imaging of breast: Secondary | ICD-10-CM

## 2015-11-17 ENCOUNTER — Emergency Department (HOSPITAL_COMMUNITY)
Admission: EM | Admit: 2015-11-17 | Discharge: 2015-11-17 | Disposition: A | Discharge status: Home / Self Care | Payer: BLUE CROSS/BLUE SHIELD | Attending: Emergency Medicine | Admitting: Emergency Medicine

## 2015-11-17 ENCOUNTER — Encounter (HOSPITAL_COMMUNITY): Payer: Self-pay | Admitting: Emergency Medicine

## 2015-11-17 ENCOUNTER — Emergency Department (HOSPITAL_COMMUNITY): Payer: BLUE CROSS/BLUE SHIELD

## 2015-11-17 DIAGNOSIS — Z8619 Personal history of other infectious and parasitic diseases: Secondary | ICD-10-CM | POA: Insufficient documentation

## 2015-11-17 DIAGNOSIS — G43909 Migraine, unspecified, not intractable, without status migrainosus: Secondary | ICD-10-CM | POA: Diagnosis not present

## 2015-11-17 DIAGNOSIS — J069 Acute upper respiratory infection, unspecified: Secondary | ICD-10-CM | POA: Diagnosis not present

## 2015-11-17 DIAGNOSIS — E669 Obesity, unspecified: Secondary | ICD-10-CM | POA: Diagnosis not present

## 2015-11-17 DIAGNOSIS — Z86018 Personal history of other benign neoplasm: Secondary | ICD-10-CM | POA: Insufficient documentation

## 2015-11-17 DIAGNOSIS — Z79899 Other long term (current) drug therapy: Secondary | ICD-10-CM | POA: Insufficient documentation

## 2015-11-17 DIAGNOSIS — R079 Chest pain, unspecified: Secondary | ICD-10-CM | POA: Diagnosis not present

## 2015-11-17 DIAGNOSIS — E559 Vitamin D deficiency, unspecified: Secondary | ICD-10-CM | POA: Diagnosis not present

## 2015-11-17 DIAGNOSIS — B9789 Other viral agents as the cause of diseases classified elsewhere: Secondary | ICD-10-CM

## 2015-11-17 DIAGNOSIS — J029 Acute pharyngitis, unspecified: Secondary | ICD-10-CM | POA: Diagnosis present

## 2015-11-17 LAB — RAPID STREP SCREEN (MED CTR MEBANE ONLY): Streptococcus, Group A Screen (Direct): NEGATIVE

## 2015-11-17 MED ORDER — ALBUTEROL SULFATE HFA 108 (90 BASE) MCG/ACT IN AERS
2.0000 | INHALATION_SPRAY | Freq: Once | RESPIRATORY_TRACT | Status: AC
  Administered 2015-11-17: 2 via RESPIRATORY_TRACT
  Filled 2015-11-17: qty 6.7

## 2015-11-17 MED ORDER — DEXAMETHASONE SODIUM PHOSPHATE 10 MG/ML IJ SOLN
10.0000 mg | Freq: Once | INTRAMUSCULAR | Status: AC
  Administered 2015-11-17: 10 mg via INTRAMUSCULAR
  Filled 2015-11-17: qty 1

## 2015-11-17 MED ORDER — HYDROCODONE-ACETAMINOPHEN 7.5-325 MG/15ML PO SOLN
10.0000 mL | Freq: Once | ORAL | Status: AC
  Administered 2015-11-17: 10 mL via ORAL
  Filled 2015-11-17: qty 15

## 2015-11-17 MED ORDER — NAPROXEN 500 MG PO TABS
500.0000 mg | ORAL_TABLET | Freq: Once | ORAL | Status: AC
  Administered 2015-11-17: 500 mg via ORAL
  Filled 2015-11-17: qty 1

## 2015-11-17 MED ORDER — BENZONATATE 100 MG PO CAPS: 100.0000 mg | ORAL_CAPSULE | Freq: Three times a day (TID) | ORAL | Status: DC | PRN

## 2015-11-17 MED ORDER — NAPROXEN 500 MG PO TABS: 500.0000 mg | ORAL_TABLET | Freq: Two times a day (BID) | ORAL | Status: DC

## 2015-11-17 NOTE — Discharge Instructions (Signed)
Take naproxen as prescribed for chest discomfort and sore throat. You may take Tessalon as needed for cough. You may use over-the-counter Chloraseptic spray for sore throat as well as cough drops. Use albuterol inhaler, 2 puffs every 4-6 hours, as needed for cough and shortness of breath. Follow-up with your primary care doctor for a recheck of symptoms.  Upper Respiratory Infection, Adult Most upper respiratory infections (URIs) are a viral infection of the air passages leading to the lungs. A URI affects the nose, throat, and upper air passages. The most common type of URI is nasopharyngitis and is typically referred to as "the common cold." URIs run their course and usually go away on their own. Most of the time, a URI does not require medical attention, but sometimes a bacterial infection in the upper airways can follow a viral infection. This is called a secondary infection. Sinus and middle ear infections are common types of secondary upper respiratory infections. Bacterial pneumonia can also complicate a URI. A URI can worsen asthma and chronic obstructive pulmonary disease (COPD). Sometimes, these complications can require emergency medical care and may be life threatening.  CAUSES Almost all URIs are caused by viruses. A virus is a type of germ and can spread from one person to another.  RISKS FACTORS You may be at risk for a URI if:   You smoke.   You have chronic heart or lung disease.  You have a weakened defense (immune) system.   You are very young or very old.   You have nasal allergies or asthma.  You work in crowded or poorly ventilated areas.  You work in health care facilities or schools. SIGNS AND SYMPTOMS  Symptoms typically develop 2-3 days after you come in contact with a cold virus. Most viral URIs last 7-10 days. However, viral URIs from the influenza virus (flu virus) can last 14-18 days and are typically more severe. Symptoms may include:   Runny or stuffy  (congested) nose.   Sneezing.   Cough.   Sore throat.   Headache.   Fatigue.   Fever.   Loss of appetite.   Pain in your forehead, behind your eyes, and over your cheekbones (sinus pain).  Muscle aches.  DIAGNOSIS  Your health care provider may diagnose a URI by:  Physical exam.  Tests to check that your symptoms are not due to another condition such as:  Strep throat.  Sinusitis.  Pneumonia.  Asthma. TREATMENT  A URI goes away on its own with time. It cannot be cured with medicines, but medicines may be prescribed or recommended to relieve symptoms. Medicines may help:  Reduce your fever.  Reduce your cough.  Relieve nasal congestion. HOME CARE INSTRUCTIONS   Take medicines only as directed by your health care provider.   Gargle warm saltwater or take cough drops to comfort your throat as directed by your health care provider.  Use a warm mist humidifier or inhale steam from a shower to increase air moisture. This may make it easier to breathe.  Drink enough fluid to keep your urine clear or pale yellow.   Eat soups and other clear broths and maintain good nutrition.   Rest as needed.   Return to work when your temperature has returned to normal or as your health care provider advises. You may need to stay home longer to avoid infecting others. You can also use a face mask and careful hand washing to prevent spread of the virus.  Increase the usage  of your inhaler if you have asthma.   Do not use any tobacco products, including cigarettes, chewing tobacco, or electronic cigarettes. If you need help quitting, ask your health care provider. PREVENTION  The best way to protect yourself from getting a cold is to practice good hygiene.   Avoid oral or hand contact with people with cold symptoms.   Wash your hands often if contact occurs.  There is no clear evidence that vitamin C, vitamin E, echinacea, or exercise reduces the chance of  developing a cold. However, it is always recommended to get plenty of rest, exercise, and practice good nutrition.  SEEK MEDICAL CARE IF:   You are getting worse rather than better.   Your symptoms are not controlled by medicine.   You have chills.  You have worsening shortness of breath.  You have brown or red mucus.  You have yellow or brown nasal discharge.  You have pain in your face, especially when you bend forward.  You have a fever.  You have swollen neck glands.  You have pain while swallowing.  You have white areas in the back of your throat. SEEK IMMEDIATE MEDICAL CARE IF:   You have severe or persistent:  Headache.  Ear pain.  Sinus pain.  Chest pain.  You have chronic lung disease and any of the following:  Wheezing.  Prolonged cough.  Coughing up blood.  A change in your usual mucus.  You have a stiff neck.  You have changes in your:  Vision.  Hearing.  Thinking.  Mood. MAKE SURE YOU:   Understand these instructions.  Will watch your condition.  Will get help right away if you are not doing well or get worse.   This information is not intended to replace advice given to you by your health care provider. Make sure you discuss any questions you have with your health care provider.   Document Released: 05/13/2001 Document Revised: 04/03/2015 Document Reviewed: 02/22/2014 Elsevier Interactive Patient Education [employer].

## 2015-11-17 NOTE — ED Notes (Signed)
Patient here with complaints of sore throat x3 days.

## 2015-11-17 NOTE — ED Provider Notes (Signed)
CSN: [id number]     Arrival date & time 11/17/15  0258 History   First MD Initiated Contact with Patient 11/17/15 0259     Chief Complaint  Patient presents with  . Sore Throat    (Consider location/radiation/quality/duration/timing/severity/associated sxs/prior Treatment) HPI Comments: 39 year old female with a history of migraine headaches and obesity presents to the emergency department for evaluation of sore throat 3 days. Symptoms have been worsening since onset. She states that she has "sinus issues" and always has a degree of nasal congestion at baseline. She denies any worsening of this. Pain is worse with swallowing. She also describes a turning central chest pain, present only with coughing. Cough has been nonproductive. Patient states that she was trying to sleep this evening, but felt as though she had difficulty breathing when lying flat. She reports subjective fever. She states that many individuals at her job are sick; she works in a call center.  Patient is a 39 y.o. female presenting with pharyngitis. The history is provided by the patient. No language interpreter was used.  Sore Throat Associated symptoms include chest pain (burning), congestion, coughing, a fever (Subjective characterized by dry lips and facial flushing) and a sore throat. Pertinent negatives include no nausea or vomiting.    Past Medical History  Diagnosis Date  . Fibroid   . Vitamin deficiency     HX OF LOW VIT D   . HSV-2 (herpes simplex virus 2) infection   . Allergy   . Back spasm   . Migraine 10/19/2014  . Obesity    Past Surgical History  Procedure Laterality Date  . Tumor removal      fibroid   Family History  Problem Relation Age of Onset  . Breast cancer Mother 37  . Breast cancer Maternal Grandmother 58  . Colon cancer Maternal Grandmother   . Diabetes Father   . Heart disease Father   . Breast cancer Maternal Aunt   . Breast cancer Maternal Aunt   . Migraines Son   .  Migraines Brother    Social History  Substance Use Topics  . Smoking status: Never Smoker   . Smokeless tobacco: Never Used  . Alcohol Use: No   OB History    Gravida Para Term Preterm AB TAB SAB Ectopic Multiple Living   4 2 1 1 2  2   1       Review of Systems  Constitutional: Positive for fever (Subjective characterized by dry lips and facial flushing).  HENT: Positive for congestion and sore throat. Negative for drooling and trouble swallowing.   Respiratory: Positive for cough and shortness of breath.   Cardiovascular: Positive for chest pain (burning).  Gastrointestinal: Negative for nausea and vomiting.  Neurological: Negative for syncope.  All other systems reviewed and are negative.   Allergies  Eye drops  Home Medications   Prior to Admission medications   Medication Sig Start Date End Date Taking? Authorizing Provider  guaiFENesin (ROBITUSSIN) 100 MG/5ML liquid Take 400 mg by mouth 3 (three) times daily as needed for cough.   Yes Historical Provider, MD  Iron Polysacch Cmplx-B12-FA (FERREX 150 FORTE) 150-0.025-1 MG CAPS Take 1 capsule by mouth daily.  10/13/13  Yes Historical Provider, MD  pseudoephedrine-acetaminophen (TYLENOL SINUS) 30-500 MG TABS tablet Take 2 tablets by mouth every 4 (four) hours as needed (cough).   Yes Historical Provider, MD  Vitamin D, Ergocalciferol, (DRISDOL) 50000 UNITS CAPS capsule Take 50,000 Units by mouth every Sunday.   Yes Historical  Provider, MD  Cholecalciferol (VITAMIN D) 1000 UNITS capsule Take 1 capsule (1,000 Units total) by mouth daily. Patient not taking: Reported on 11/17/2015 04/18/13   Theda Sers, PA-C  cyclobenzaprine (FLEXERIL) 10 MG tablet Take 1 tablet (10 mg total) by mouth 3 (three) times daily as needed for muscle spasms. Patient not taking: Reported on 11/17/2015 04/14/13   Chelle Jeffery, PA-C  ipratropium (ATROVENT) 0.03 % nasal spray Place 2 sprays into both nostrils 2 (two) times daily. Patient not taking:  Reported on 11/17/2015 12/01/14   Harrison Mons, PA-C  nortriptyline (PAMELOR) 10 MG capsule Take one capsule at night for one week, then take 2 capsules at night for one week, then take 3 capsules at night Patient not taking: Reported on 11/17/2015 01/31/15   Kathrynn Ducking, MD  SUMAtriptan (IMITREX) 100 MG tablet Take 1 tablet (100 mg total) by mouth 2 (two) times daily as needed for migraine. Patient not taking: Reported on 11/17/2015 01/31/15   Kathrynn Ducking, MD  topiramate (TOPAMAX) 50 MG tablet Take 2 tablets (100 mg total) by mouth at bedtime. Patient not taking: Reported on 11/17/2015 10/19/14   Kathrynn Ducking, MD   BP 127/99 mmHg  Pulse 95  Temp(Src) 98.2 F (36.8 C) (Oral)  Resp 18  SpO2 99%  LMP 11/10/2015   Physical Exam  Constitutional: She is oriented to person, place, and time. She appears well-developed and well-nourished. No distress.  Nontoxic/nonseptic appearing  HENT:  Head: Normocephalic and atraumatic.  Mouth/Throat: No oropharyngeal exudate.  Mild posterior oropharyngeal erythema without edema. No exudates. Oropharynx clear. Patient tolerating secretions without difficulty. No tripoding.  Eyes: Conjunctivae and EOM are normal. No scleral icterus.  Neck: Normal range of motion. Neck supple.  No stridor. No nuchal rigidity or meningismus  Cardiovascular: Normal rate, regular rhythm and intact distal pulses.   Patient not tachycardic as noted in triage  Pulmonary/Chest: Effort normal and breath sounds normal. No respiratory distress. She has no wheezes. She has no rales.  Lungs clear to auscultation bilaterally. No accessory muscle use. Chest expansion symmetric.  Musculoskeletal: Normal range of motion.  Neurological: She is alert and oriented to person, place, and time. She exhibits normal muscle tone. Coordination normal.  Skin: Skin is warm and dry. No rash noted. She is not diaphoretic. No erythema. No pallor.  Psychiatric: She has a normal mood and affect.  Her behavior is normal.  Nursing note and vitals reviewed.   ED Course  Procedures (including critical care time) Labs Review Labs Reviewed  RAPID STREP SCREEN (NOT AT [facility])  CULTURE, GROUP A STREP    Imaging Review Dg Neck Soft Tissue  11/17/2015  CLINICAL DATA:  Anterior throat pain for 3 days. Sore throat and breathing difficulty. EXAM: NECK SOFT TISSUES - 1+ VIEW COMPARISON:  None. FINDINGS: There is no evidence of retropharyngeal soft tissue swelling or epiglottic enlargement. The cervical airway is unremarkable and no radio-opaque foreign body identified. Bilateral cervical ribs. IMPRESSION: Negative. Electronically Signed   By: Lucienne Capers M.D.   On: 11/17/2015 04:12     I have personally reviewed and evaluated these images and lab results as part of my medical decision-making.   EKG Interpretation None      MDM   Final diagnoses:  Viral URI with cough    Patient c/o mild to moderate symptoms of congestion and sore throat with cough for less than 10 days. Patient is afebrile. Symptoms are consistent with URI, likely viral etiology. Discussed that  antibiotics are not indicated for viral infections. Pt will be discharged with symptomatic treatment.  Patient verbalizes understanding and is agreeable with plan. Pt is hemodynamically stable and in NAD prior to discharge.     Antonietta Breach, PA-C 11/17/15 [id number]  Varney Biles, MD 11/17/15 2203

## 2015-11-19 ENCOUNTER — Other Ambulatory Visit: Payer: Self-pay

## 2015-11-19 LAB — CULTURE, GROUP A STREP: STREP A CULTURE: NEGATIVE

## 2015-12-21 ENCOUNTER — Other Ambulatory Visit: Payer: Self-pay | Admitting: Obstetrics and Gynecology

## 2016-02-14 ENCOUNTER — Ambulatory Visit (INDEPENDENT_AMBULATORY_CARE_PROVIDER_SITE_OTHER): Payer: Managed Care, Other (non HMO) | Admitting: Family Medicine

## 2016-02-14 VITALS — BP 120/76 | HR 82 | Temp 98.6°F | Resp 18 | Ht 69.0 in | Wt 328.8 lb

## 2016-02-14 DIAGNOSIS — B9689 Other specified bacterial agents as the cause of diseases classified elsewhere: Secondary | ICD-10-CM

## 2016-02-14 DIAGNOSIS — A5901 Trichomonal vulvovaginitis: Secondary | ICD-10-CM

## 2016-02-14 DIAGNOSIS — A499 Bacterial infection, unspecified: Secondary | ICD-10-CM

## 2016-02-14 DIAGNOSIS — R6889 Other general symptoms and signs: Secondary | ICD-10-CM | POA: Diagnosis not present

## 2016-02-14 DIAGNOSIS — N760 Acute vaginitis: Secondary | ICD-10-CM

## 2016-02-14 DIAGNOSIS — N898 Other specified noninflammatory disorders of vagina: Secondary | ICD-10-CM | POA: Diagnosis not present

## 2016-02-14 DIAGNOSIS — Z711 Person with feared health complaint in whom no diagnosis is made: Secondary | ICD-10-CM | POA: Diagnosis not present

## 2016-02-14 LAB — POCT WET + KOH PREP
YEAST BY KOH: ABSENT
YEAST BY WET PREP: ABSENT

## 2016-02-14 LAB — POCT INFLUENZA A/B
INFLUENZA A, POC: NEGATIVE
Influenza B, POC: NEGATIVE

## 2016-02-14 MED ORDER — METRONIDAZOLE 500 MG PO TABS: 500.0000 mg | ORAL_TABLET | Freq: Two times a day (BID) | ORAL | Status: DC

## 2016-02-14 NOTE — Progress Notes (Signed)
Patient ID: Lauren Daniels, female    DOB: 29-Jan-1976  Age: 40 y.o. MRN: [id number]  Chief Complaint  Patient presents with  . Vaginitis    x 2 weeks    Subjective:   40 year old lady who is here for vaginal discharge which is been going on this week. She has not been able to get in to complete work. She is itching. She has not been sexually involved for a while. Her only sexual partner is her fianc, and she does not feel like she has any risk of STD but is willing to get the nuclear chlamydia testing done without HIV testing. No abdominal pain. Breasts she has had some achiness today with some joint pains and a mild cough. No fevers. Mother probably has the flu.  Current allergies, medications, problem list, past/family and social histories reviewed.  Objective:  BP 120/76 mmHg  Pulse 82  Temp(Src) 98.6 F (37 C)  Resp 18  Ht 5\' 9"  (1.753 m)  Wt 328 lb 12.8 oz (149.143 kg)  BMI 48.53 kg/m2  SpO2 97%  LMP 01/21/2016 (Approximate)  Obese lady in no acute distress. Head is mildly congested. Throat clear. Neck supple. Chest clear. Heart regular. Normal external genitalia. She has a little erythema of the vaginal mucosa. There is a elevated creamy discharge. Wet prep/ KOH was taken.  Assessment & Plan:   Assessment: 1. Vaginal discharge   2. Flu-like symptoms   3. BV (bacterial vaginosis)   4. Trichomonas vaginitis       Plan: Will check on the etiology of the vaginal discharge with wet prep. I went ahead and did a flu swab on her since she has been exposed.  Orders Placed This Encounter  Procedures  . GC/Chlamydia Probe Amp    Order Specific Question:  Source    Answer:  vaginal  . POCT Influenza A/B  . POCT Wet + KOH Prep    Meds ordered this encounter  Medications  . metroNIDAZOLE (FLAGYL) 500 MG tablet    Sig: Take 1 tablet (500 mg total) by mouth 2 (two) times daily with a meal. DO NOT CONSUME ALCOHOL WHILE TAKING THIS MEDICATION.    Dispense:  14 tablet   Refill:  0         Patient Instructions  You have a trichomonas infection in your vagina as well as bacterial vaginosis. Take the metronidazole 500 mg one twice daily  Your fianc needs to receive treatment also  Bacterial Vaginosis Bacterial vaginosis is a vaginal infection that occurs when the normal balance of bacteria in the vagina is disrupted. It results from an overgrowth of certain bacteria. This is the most common vaginal infection in women of childbearing age. Treatment is important to prevent complications, especially in pregnant women, as it can cause a premature delivery. CAUSES  Bacterial vaginosis is caused by an increase in harmful bacteria that are normally present in smaller amounts in the vagina. Several different kinds of bacteria can cause bacterial vaginosis. However, the reason that the condition develops is not fully understood. RISK FACTORS Certain activities or behaviors can put you at an increased risk of developing bacterial vaginosis, including:  Having a new sex partner or multiple sex partners.  Douching.  Using an intrauterine device (IUD) for contraception. Women do not get bacterial vaginosis from toilet seats, bedding, swimming pools, or contact with objects around them. SIGNS AND SYMPTOMS  Some women with bacterial vaginosis have no signs or symptoms. Common symptoms include:  Grey vaginal discharge.  A fishlike odor with discharge, especially after sexual intercourse.  Itching or burning of the vagina and vulva.  Burning or pain with urination. DIAGNOSIS  Your health care provider will take a medical history and examine the vagina for signs of bacterial vaginosis. A sample of vaginal fluid may be taken. Your health care provider will look at this sample under a microscope to check for bacteria and abnormal cells. A vaginal pH test may also be done.  TREATMENT  Bacterial vaginosis may be treated with antibiotic medicines. These may be given  in the form of a pill or a vaginal cream. A second round of antibiotics may be prescribed if the condition comes back after treatment. Because bacterial vaginosis increases your risk for sexually transmitted diseases, getting treated can help reduce your risk for chlamydia, gonorrhea, HIV, and herpes. HOME CARE INSTRUCTIONS   Only take over-the-counter or prescription medicines as directed by your health care provider.  If antibiotic medicine was prescribed, take it as directed. Make sure you finish it even if you start to feel better.  Tell all sexual partners that you have a vaginal infection. They should see their health care provider and be treated if they have problems, such as a mild rash or itching.  During treatment, it is important that you follow these instructions:  Avoid sexual activity or use condoms correctly.  Do not douche.  Avoid alcohol as directed by your health care provider.  Avoid breastfeeding as directed by your health care provider. SEEK MEDICAL CARE IF:   Your symptoms are not improving after 3 days of treatment.  You have increased discharge or pain.  You have a fever. MAKE SURE YOU:   Understand these instructions.  Will watch your condition.  Will get help right away if you are not doing well or get worse. FOR MORE INFORMATION  Centers for Disease Control and Prevention, Division of STD Prevention: [employer]raud[employer] American Sexual Health Association (ASHA): www.ashastd.org    This information is not intended to replace advice given to you by your health care provider. Make sure you discuss any questions you have with your health care provider.   Document Released: 11/17/2005 Document Revised: 12/08/2014 Document Reviewed: 06/29/2013 Elsevier Interactive Patient Education 2016 [employer]. Trichomoniasis Trichomoniasis is an infection caused by an organism called Trichomonas. The infection can affect both women and men. In women, the outer female  genitalia and the vagina are affected. In men, the penis is mainly affected, but the prostate and other reproductive organs can also be involved. Trichomoniasis is a sexually transmitted infection (STI) and is most often passed to another person through sexual contact.  RISK FACTORS  Having unprotected sexual intercourse.  Having sexual intercourse with an infected partner. SIGNS AND SYMPTOMS  Symptoms of trichomoniasis in women include:  Abnormal gray-green frothy vaginal discharge.  Itching and irritation of the vagina.  Itching and irritation of the area outside the vagina. Symptoms of trichomoniasis in men include:   Penile discharge with or without pain.  Pain during urination. This results from inflammation of the urethra. DIAGNOSIS  Trichomoniasis may be found during a Pap test or physical exam. Your health care provider may use one of the following methods to help diagnose this infection:  Testing the pH of the vagina with a test tape.  Using a vaginal swab test that checks for the Trichomonas organism. A test is available that provides results within a few minutes.  Examining a urine sample.  Testing vaginal secretions. Your health  care provider may test you for other STIs, including HIV. TREATMENT   You may be given medicine to fight the infection. Women should inform their health care provider if they could be or are pregnant. Some medicines used to treat the infection should not be taken during pregnancy.  Your health care provider may recommend over-the-counter medicines or creams to decrease itching or irritation.  Your sexual partner will need to be treated if infected.  Your health care provider may test you for infection again 3 months after treatment. HOME CARE INSTRUCTIONS   Take medicines only as directed by your health care provider.  Take over-the-counter medicine for itching or irritation as directed by your health care provider.  Do not have sexual  intercourse while you have the infection.  Women should not douche or wear tampons while they have the infection.  Discuss your infection with your partner. Your partner may have gotten the infection from you, or you may have gotten it from your partner.  Have your sex partner get examined and treated if necessary.  Practice safe, informed, and protected sex.  See your health care provider for other STI testing. SEEK MEDICAL CARE IF:   You still have symptoms after you finish your medicine.  You develop abdominal pain.  You have pain when you urinate.  You have bleeding after sexual intercourse.  You develop a rash.  Your medicine makes you sick or makes you throw up (vomit). MAKE SURE YOU:  Understand these instructions.  Will watch your condition.  Will get help right away if you are not doing well or get worse.   This information is not intended to replace advice given to you by your health care provider. Make sure you discuss any questions you have with your health care provider.   Document Released: 05/13/2001 Document Revised: 12/08/2014 Document Reviewed: 08/29/2013 Elsevier Interactive Patient Education [employer].      Return if symptoms worsen or fail to improve.   HOPPER,DAVID, MD 02/14/2016

## 2016-02-14 NOTE — Patient Instructions (Signed)
You have a trichomonas infection in your vagina as well as bacterial vaginosis. Take the metronidazole 500 mg one twice daily  Your fianc needs to receive treatment also  Bacterial Vaginosis Bacterial vaginosis is a vaginal infection that occurs when the normal balance of bacteria in the vagina is disrupted. It results from an overgrowth of certain bacteria. This is the most common vaginal infection in women of childbearing age. Treatment is important to prevent complications, especially in pregnant women, as it can cause a premature delivery. CAUSES  Bacterial vaginosis is caused by an increase in harmful bacteria that are normally present in smaller amounts in the vagina. Several different kinds of bacteria can cause bacterial vaginosis. However, the reason that the condition develops is not fully understood. RISK FACTORS Certain activities or behaviors can put you at an increased risk of developing bacterial vaginosis, including:  Having a new sex partner or multiple sex partners.  Douching.  Using an intrauterine device (IUD) for contraception. Women do not get bacterial vaginosis from toilet seats, bedding, swimming pools, or contact with objects around them. SIGNS AND SYMPTOMS  Some women with bacterial vaginosis have no signs or symptoms. Common symptoms include:  Grey vaginal discharge.  A fishlike odor with discharge, especially after sexual intercourse.  Itching or burning of the vagina and vulva.  Burning or pain with urination. DIAGNOSIS  Your health care provider will take a medical history and examine the vagina for signs of bacterial vaginosis. A sample of vaginal fluid may be taken. Your health care provider will look at this sample under a microscope to check for bacteria and abnormal cells. A vaginal pH test may also be done.  TREATMENT  Bacterial vaginosis may be treated with antibiotic medicines. These may be given in the form of a pill or a vaginal cream. A second  round of antibiotics may be prescribed if the condition comes back after treatment. Because bacterial vaginosis increases your risk for sexually transmitted diseases, getting treated can help reduce your risk for chlamydia, gonorrhea, HIV, and herpes. HOME CARE INSTRUCTIONS   Only take over-the-counter or prescription medicines as directed by your health care provider.  If antibiotic medicine was prescribed, take it as directed. Make sure you finish it even if you start to feel better.  Tell all sexual partners that you have a vaginal infection. They should see their health care provider and be treated if they have problems, such as a mild rash or itching.  During treatment, it is important that you follow these instructions:  Avoid sexual activity or use condoms correctly.  Do not douche.  Avoid alcohol as directed by your health care provider.  Avoid breastfeeding as directed by your health care provider. SEEK MEDICAL CARE IF:   Your symptoms are not improving after 3 days of treatment.  You have increased discharge or pain.  You have a fever. MAKE SURE YOU:   Understand these instructions.  Will watch your condition.  Will get help right away if you are not doing well or get worse. FOR MORE INFORMATION  Centers for Disease Control and Prevention, Division of STD Prevention: [employer]raud[employer] American Sexual Health Association (ASHA): www.ashastd.org    This information is not intended to replace advice given to you by your health care provider. Make sure you discuss any questions you have with your health care provider.   Document Released: 11/17/2005 Document Revised: 12/08/2014 Document Reviewed: 06/29/2013 Elsevier Interactive Patient Education 2016 [employer]. Trichomoniasis Trichomoniasis is an infection caused by  an organism called Trichomonas. The infection can affect both women and men. In women, the outer female genitalia and the vagina are affected. In men, the  penis is mainly affected, but the prostate and other reproductive organs can also be involved. Trichomoniasis is a sexually transmitted infection (STI) and is most often passed to another person through sexual contact.  RISK FACTORS  Having unprotected sexual intercourse.  Having sexual intercourse with an infected partner. SIGNS AND SYMPTOMS  Symptoms of trichomoniasis in women include:  Abnormal gray-green frothy vaginal discharge.  Itching and irritation of the vagina.  Itching and irritation of the area outside the vagina. Symptoms of trichomoniasis in men include:   Penile discharge with or without pain.  Pain during urination. This results from inflammation of the urethra. DIAGNOSIS  Trichomoniasis may be found during a Pap test or physical exam. Your health care provider may use one of the following methods to help diagnose this infection:  Testing the pH of the vagina with a test tape.  Using a vaginal swab test that checks for the Trichomonas organism. A test is available that provides results within a few minutes.  Examining a urine sample.  Testing vaginal secretions. Your health care provider may test you for other STIs, including HIV. TREATMENT   You may be given medicine to fight the infection. Women should inform their health care provider if they could be or are pregnant. Some medicines used to treat the infection should not be taken during pregnancy.  Your health care provider may recommend over-the-counter medicines or creams to decrease itching or irritation.  Your sexual partner will need to be treated if infected.  Your health care provider may test you for infection again 3 months after treatment. HOME CARE INSTRUCTIONS   Take medicines only as directed by your health care provider.  Take over-the-counter medicine for itching or irritation as directed by your health care provider.  Do not have sexual intercourse while you have the infection.  Women  should not douche or wear tampons while they have the infection.  Discuss your infection with your partner. Your partner may have gotten the infection from you, or you may have gotten it from your partner.  Have your sex partner get examined and treated if necessary.  Practice safe, informed, and protected sex.  See your health care provider for other STI testing. SEEK MEDICAL CARE IF:   You still have symptoms after you finish your medicine.  You develop abdominal pain.  You have pain when you urinate.  You have bleeding after sexual intercourse.  You develop a rash.  Your medicine makes you sick or makes you throw up (vomit). MAKE SURE YOU:  Understand these instructions.  Will watch your condition.  Will get help right away if you are not doing well or get worse.   This information is not intended to replace advice given to you by your health care provider. Make sure you discuss any questions you have with your health care provider.   Document Released: 05/13/2001 Document Revised: 12/08/2014 Document Reviewed: 08/29/2013 Elsevier Interactive Patient Education [employer].

## 2016-02-15 LAB — HIV ANTIBODY (ROUTINE TESTING W REFLEX): HIV 1&2 Ab, 4th Generation: NONREACTIVE

## 2016-02-16 LAB — GC/CHLAMYDIA PROBE AMP
CT Probe RNA: NOT DETECTED
GC Probe RNA: NOT DETECTED

## 2016-02-16 LAB — RPR

## 2017-02-07 ENCOUNTER — Emergency Department (HOSPITAL_COMMUNITY)
Admission: EM | Admit: 2017-02-07 | Discharge: 2017-02-07 | Disposition: A | Discharge status: Home / Self Care | Payer: Managed Care, Other (non HMO) | Attending: Emergency Medicine | Admitting: Emergency Medicine

## 2017-02-07 ENCOUNTER — Emergency Department (HOSPITAL_COMMUNITY): Payer: Managed Care, Other (non HMO)

## 2017-02-07 ENCOUNTER — Encounter (HOSPITAL_COMMUNITY): Payer: Self-pay | Admitting: Emergency Medicine

## 2017-02-07 DIAGNOSIS — R0602 Shortness of breath: Secondary | ICD-10-CM | POA: Diagnosis present

## 2017-02-07 DIAGNOSIS — R0600 Dyspnea, unspecified: Secondary | ICD-10-CM | POA: Diagnosis not present

## 2017-02-07 DIAGNOSIS — J111 Influenza due to unidentified influenza virus with other respiratory manifestations: Secondary | ICD-10-CM

## 2017-02-07 MED ORDER — FLUTICASONE PROPIONATE 50 MCG/ACT NA SUSP: 2.0000 | Freq: Every day | NASAL | 0 refills | Status: DC

## 2017-02-07 NOTE — ED Provider Notes (Signed)
[facility] DEPT Provider Note   CSN: [id number] Arrival date & time: 02/07/17  1712  By signing my name below, I, Neta Mends, attest that this documentation has been prepared under the direction and in the presence of Miguel Christiana, [place]. Electronically Signed: Neta Mends, ED Scribe. 02/07/2017. 6:04 PM.   History   Chief Complaint Chief Complaint  Patient presents with  . Sleep Apnea    The history is provided by the patient. No language interpreter was used.   HPI Comments:  Lauren Daniels is a 41 y.o. female with PMHx of HSV-2 who presents to the Emergency Department complaining of 3 episodes of shortness of breath that occurred last night. Pt reports that last night she had 3 episodes of in which she woke up gasping for air. She states that she was diagnosed with the flu A yesterday and was prescribed Tamiflu and tessalon perles. Pt complains of associated nasal congestion. Pt reports that often when she sleeps she makes an abnormal sound, her husband has recently alerted her to. Pt denies any recent long travel, denies exogenous, denies leg swelling. She also notes that recently she has had a significant weight gain. No alleviating factors noted. Pt denies SOB, CP, difficulty swallowing at all during the day.   Denies excessive daytime sleepiness.  Past Medical History:  Diagnosis Date  . Allergy   . Back spasm   . Fibroid   . HSV-2 (herpes simplex virus 2) infection   . Migraine 10/19/2014  . Obesity   . Vitamin deficiency    HX OF LOW VIT D     Patient Active Problem List   Diagnosis Date Noted  . Abnormal mammogram of left breast 12/01/2014  . Migraine 10/19/2014  . Appetite disorder 10/13/2013  . Extreme obesity ([place]) 10/13/2013  . Back spasm 04/14/2013  . Fibroid   . Vitamin D deficiency   . HSV-2 (herpes simplex virus 2) infection     Past Surgical History:  Procedure Laterality Date  . TUMOR REMOVAL     fibroid    OB History    Gravida Para Term Preterm AB Living   4 2 1 1 2 1    SAB TAB Ectopic Multiple Live Births   2       1       Home Medications    Prior to Admission medications   Medication Sig Start Date End Date Taking? Authorizing Provider  Cholecalciferol (VITAMIN D) 1000 UNITS capsule Take 1 capsule (1,000 Units total) by mouth daily. Patient not taking: Reported on 11/17/2015 04/18/13   Theda Sers, PA-C  fluticasone [facility]) 50 MCG/ACT nasal spray Place 2 sprays into both nostrils daily. 02/07/17   Clayton Bibles, PA-C  guaiFENesin (ROBITUSSIN) 100 MG/5ML liquid Take 400 mg by mouth 3 (three) times daily as needed for cough. Reported on 02/14/2016    Historical Provider, MD  Iron Polysacch Cmplx-B12-FA (FERREX 150 FORTE) 150-0.025-1 MG CAPS Take 1 capsule by mouth daily.  10/13/13   Historical Provider, MD  metroNIDAZOLE (FLAGYL) 500 MG tablet Take 1 tablet (500 mg total) by mouth 2 (two) times daily with a meal. DO NOT CONSUME ALCOHOL WHILE TAKING THIS MEDICATION. 02/14/16   Posey Boyer, MD  naproxen (NAPROSYN) 500 MG tablet Take 1 tablet (500 mg total) by mouth 2 (two) times daily. Patient not taking: Reported on 02/14/2016 11/17/15   Antonietta Breach, PA-C  nortriptyline (PAMELOR) 10 MG capsule Take one capsule at night for one week, then take  2 capsules at night for one week, then take 3 capsules at night Patient not taking: Reported on 11/17/2015 01/31/15   Kathrynn Ducking, MD  pseudoephedrine-acetaminophen (TYLENOL SINUS) 30-500 MG TABS tablet Take 2 tablets by mouth every 4 (four) hours as needed (cough). Reported on 02/14/2016    Historical Provider, MD  SUMAtriptan (IMITREX) 100 MG tablet Take 1 tablet (100 mg total) by mouth 2 (two) times daily as needed for migraine. Patient not taking: Reported on 11/17/2015 01/31/15   Kathrynn Ducking, MD  topiramate (TOPAMAX) 50 MG tablet Take 2 tablets (100 mg total) by mouth at bedtime. Patient not taking: Reported on 11/17/2015 10/19/14   Kathrynn Ducking, MD    Vitamin D, Ergocalciferol, (DRISDOL) 50000 UNITS CAPS capsule Take 50,000 Units by mouth every Sunday. Reported on 02/14/2016    Historical Provider, MD    Family History Family History  Problem Relation Age of Onset  . Breast cancer Mother 77  . Breast cancer Maternal Grandmother 32  . Colon cancer Maternal Grandmother   . Diabetes Father   . Heart disease Father   . Breast cancer Maternal Aunt   . Breast cancer Maternal Aunt   . Migraines Son   . Migraines Brother     Social History Social History  Substance Use Topics  . Smoking status: Never Smoker  . Smokeless tobacco: Never Used  . Alcohol use No     Allergies   Eye drops [naphazoline-polyethyl glycol]   Review of Systems Review of Systems  HENT: Positive for congestion, rhinorrhea and sneezing. Negative for sore throat and trouble swallowing.   Respiratory: Positive for cough. Negative for chest tightness and shortness of breath.   Cardiovascular: Negative for chest pain.  Gastrointestinal: Negative for abdominal pain.  Allergic/Immunologic: Negative for immunocompromised state.  Neurological: Negative for seizures and weakness.  Psychiatric/Behavioral: Negative for self-injury.     Physical Exam Updated Vital Signs BP 133/91   Pulse 85   Temp 98.1 F (36.7 C) (Oral)   Resp 17   LMP 01/31/2017   SpO2 99%   Physical Exam  Constitutional: She appears well-developed and well-nourished. No distress.  HENT:  Head: Normocephalic and atraumatic.  Mouth/Throat: Uvula is midline, oropharynx is clear and moist and mucous membranes are normal. No oropharyngeal exudate, posterior oropharyngeal edema, posterior oropharyngeal erythema or tonsillar abscesses.  Nasal discharge, pharynx erythematous without edema or exudate. No peritonsillar abscess. No anterior neck edema.   Eyes: Conjunctivae are normal.  Neck: Normal range of motion. Neck supple.  Cardiovascular: Normal rate and regular rhythm.    Pulmonary/Chest: Effort normal and breath sounds normal. No stridor. No respiratory distress. She has no wheezes. She has no rales.  Lymphadenopathy:    She has no cervical adenopathy.  Neurological: She is alert.  Skin: She is not diaphoretic.  Nursing note and vitals reviewed.    ED Treatments / Results  DIAGNOSTIC STUDIES:  Oxygen Saturation is 100% on RA, normal by my interpretation.    COORDINATION OF CARE:  6:01 PM Discussed treatment plan with pt at bedside and pt agreed to plan.   Labs (all labs ordered are listed, but only abnormal results are displayed) Labs Reviewed - No data to display  EKG  EKG Interpretation None       Radiology Dg Chest 2 View  Result Date: 02/07/2017 CLINICAL DATA:  Shortness of breath and cough for 1 day. EXAM: CHEST  2 VIEW COMPARISON:  None. FINDINGS: The cardiomediastinal silhouette is unremarkable. There  is no evidence of focal airspace disease, pulmonary edema, suspicious pulmonary nodule/mass, pleural effusion, or pneumothorax. No acute bony abnormalities are identified. IMPRESSION: No active cardiopulmonary disease. Electronically Signed   By: Margarette Canada M.D.   On: 02/07/2017 18:58    Procedures Procedures (including critical care time)  Medications Ordered in ED Medications - No data to display   Initial Impression / Assessment and Plan / ED Course  I have reviewed the triage vital signs and the nursing notes.  Pertinent labs & imaging results that were available during my care of the patient were reviewed by me and considered in my medical decision making (see chart for details).    Afebrile, nontoxic patient with recent influenza A diagnosis with Tamiflu and Tessalon perles prescribed.  Pt had concerning episodes of dyspnea while sleeping last night- none during the day.  No concerning findings on exam.   CXR negative. No current airway concerns.  Discussed pt with Dr Dayna Barker. I advised patient to stop both of the  medications, recommended medication for nasal congestion.  While pt may have underlying sleep apnea, she may also have had nasal congestion and postnasal drip contributing to her symptoms.  Discharged home with close PCP follow up, recommended sleep study.  Discussed result, findings, treatment, and follow up  with patient.  Pt given return precautions.  Pt verbalizes understanding and agrees with plan.      Final Clinical Impressions(s) / ED Diagnoses   Final diagnoses:  Influenza  Nocturnal dyspnea    New Prescriptions Discharge Medication List as of 02/07/2017  7:20 PM    START taking these medications   Details  fluticasone (FLONASE) 50 MCG/ACT nasal spray Place 2 sprays into both nostrils daily., Starting Sat 02/07/2017, Print        I personally performed the services described in this documentation, which was scribed in my presence. The recorded information has been reviewed and is accurate.     Clayton Bibles, PA-C 02/07/17 2010    Merrily Pew, MD 02/09/17 [phone]

## 2017-02-07 NOTE — Discharge Instructions (Signed)
Read the information below.  Use the prescribed medication as directed.  Please discuss all new medications with your pharmacist.  You may return to the Emergency Department at any time for worsening condition or any new symptoms that concern you.   If you develop chest pain, worsening shortness of breath, you pass out, or become weak or dizzy, return to the ER for a recheck.     Please stop taking the Tamiflu and the Tessalon Perles.  Discuss the possibility of ordering a sleep study with your primary care provider.

## 2017-02-07 NOTE — ED Triage Notes (Signed)
Pt states she was diagnosed with flu yesterday. She has started Tamiflu yesterday and tessalon perles. She states she had about three episodes of waking up and gasping for air last night. Denies shortness of breath.

## 2017-02-11 ENCOUNTER — Ambulatory Visit (INDEPENDENT_AMBULATORY_CARE_PROVIDER_SITE_OTHER): Payer: Managed Care, Other (non HMO) | Admitting: Family Medicine

## 2017-02-11 VITALS — BP 130/84 | HR 87 | Temp 98.3°F | Resp 16 | Ht 69.0 in | Wt 344.4 lb

## 2017-02-11 DIAGNOSIS — R0681 Apnea, not elsewhere classified: Secondary | ICD-10-CM

## 2017-02-11 DIAGNOSIS — R05 Cough: Secondary | ICD-10-CM | POA: Diagnosis not present

## 2017-02-11 DIAGNOSIS — R058 Other specified cough: Secondary | ICD-10-CM

## 2017-02-11 MED ORDER — KETOTIFEN FUMARATE 0.025 % OP SOLN: 1.0000 [drp] | Freq: Two times a day (BID) | OPHTHALMIC | 0 refills | Status: DC | PRN

## 2017-02-11 NOTE — Progress Notes (Signed)
Lauren Daniels is a 41 y.o. female who presents to Primary Care at [facility] today for:  1.  Flu: Started feeling sick on Thursday of last week. Went to doctor on [facility] Flu test was positive for Influenxa A. Given tamiflu and cough medicine. Had some episodes of apnea in sleep. Also started having swelling and itching of eyes, lip swelling, sore throat. Went back to [facility] clinic due to symptoms and was sent to ED. Did CXR which was normal. Patient told to discontinue medication for fear of allergic reaction. Given Flonase for congestion. Finished course of Tamiflu but stopped cough medicine. Started taking benedryl for allergy symptoms. Yesterday symtpoms started improving however started having bad cough. Coughing up normal mucous. Coughing so much chest hurting.    2. Apnea . Had episode of apnea during sleep a couple nights ago. Went to ED and work-up was negative. patient believed to have component of OSA vs nasal congestion that is obstructing airway. Patient states that this has never happened before. She has gained some weight recently and noticed some new breathing sounds; unsure if actually snoring. Sleeps without awakening. Works 3rd shift. Has daytime somnolence. Easily falls asleep when out.   ROS as above.  Pertinently, palpitations, SOB, Fever, Chills, Abd pain, N/V/D.   PMH reviewed. Patient is a nonsmoker.   Past Medical History:  Diagnosis Date  . Allergy   . Back spasm   . Fibroid   . HSV-2 (herpes simplex virus 2) infection   . Migraine 10/19/2014  . Obesity   . Vitamin deficiency    HX OF LOW VIT D    Past Surgical History:  Procedure Laterality Date  . TUMOR REMOVAL     fibroid    Medications reviewed. Current Outpatient Prescriptions  Medication Sig Dispense Refill  . fluticasone (FLONASE) 50 MCG/ACT nasal spray Place 2 sprays into both nostrils daily. 16 g 0  . Cholecalciferol (VITAMIN D) 1000 UNITS capsule Take 1 capsule (1,000 Units  total) by mouth daily. (Patient not taking: Reported on 11/17/2015) 30 capsule 11  . guaiFENesin (ROBITUSSIN) 100 MG/5ML liquid Take 400 mg by mouth 3 (three) times daily as needed for cough. Reported on 02/14/2016    . Iron Polysacch Cmplx-B12-FA (FERREX 150 FORTE) 150-0.025-1 MG CAPS Take 1 capsule by mouth daily.     . metroNIDAZOLE (FLAGYL) 500 MG tablet Take 1 tablet (500 mg total) by mouth 2 (two) times daily with a meal. DO NOT CONSUME ALCOHOL WHILE TAKING THIS MEDICATION. (Patient not taking: Reported on 02/11/2017) 14 tablet 0  . naproxen (NAPROSYN) 500 MG tablet Take 1 tablet (500 mg total) by mouth 2 (two) times daily. (Patient not taking: Reported on 02/14/2016) 30 tablet 0  . nortriptyline (PAMELOR) 10 MG capsule Take one capsule at night for one week, then take 2 capsules at night for one week, then take 3 capsules at night (Patient not taking: Reported on 11/17/2015) 90 capsule 3  . pseudoephedrine-acetaminophen (TYLENOL SINUS) 30-500 MG TABS tablet Take 2 tablets by mouth every 4 (four) hours as needed (cough). Reported on 02/14/2016    . SUMAtriptan (IMITREX) 100 MG tablet Take 1 tablet (100 mg total) by mouth 2 (two) times daily as needed for migraine. (Patient not taking: Reported on 11/17/2015) 10 tablet 2  . topiramate (TOPAMAX) 50 MG tablet Take 2 tablets (100 mg total) by mouth at bedtime. (Patient not taking: Reported on 11/17/2015) 60 tablet 3  . Vitamin D, Ergocalciferol, (DRISDOL) 50000 UNITS CAPS  capsule Take 50,000 Units by mouth every Sunday. Reported on 02/14/2016     No current facility-administered medications for this visit.      Physical Exam:  BP 130/84   Pulse 87   Temp 98.3 F (36.8 C) (Oral)   Resp 16   Ht 5\' 9"  (1.753 m)   Wt (!) 344 lb 6.4 oz (156.2 kg)   LMP 01/31/2017   SpO2 99%   BMI 50.86 kg/m  Gen:  Alert, cooperative patient who appears stated age in no acute distress.  Vital signs reviewed. HEENT: EOMI,  MMM, o/p clear. No cervical  lymphadenopathy.  Pulm:  Clear to auscultation bilaterally with good air movement.  No wheezes or rales noted.  Normal work of breathing.  Cardiac:  Regular rate and rhythm without murmur auscultated.  Good S1/S2. Abd:  Soft/nondistended/nontender.   Exts: Non edematous BL  LE, warm and well perfused.   Assessment and Plan: 1. Post-viral cough syndrome Symptoms most consistent with post-viral cough. Recently with diagnosis of flu; completed Tamiflu. No red flags. Respiratory status stable. Afebrile and well-appearing. No concern for post-flu pneumonia. Lung exam normal. Conservative measures discussed. Believe she has a component of allergies as well. Patient to continue benadryl prn for allergies. Rx given for allergy eye drops for itchy, watery eyes.   2. Apnea spell Concern for underlying sleep apnea. Reccommended patient follow-up with PCP after acute episode of illness resolves. Patient would be a good candidate for a sleep study. Has had no more episodes of apnea. Encouraged weight loss. Will forward to PCP.    Luiz Blare, DO 02/11/2017, 9:28 AM PGY-3, [place]

## 2017-02-11 NOTE — Patient Instructions (Addendum)
Cough, Adult A cough helps to clear your throat and lungs. A cough may last only 2-3 weeks (acute), or it may last longer than 8 weeks (chronic). Many different things can cause a cough. A cough may be a sign of an illness or another medical condition. Follow these instructions at home:  Pay attention to any changes in your cough.  Take medicines only as told by your doctor.  If you were prescribed an antibiotic medicine, take it as told by your doctor. Do not stop taking it even if you start to feel better.  Talk with your doctor before you try using a cough medicine.  Drink enough fluid to keep your pee (urine) clear or pale yellow.  If the air is dry, use a cold steam vaporizer or humidifier in your home.  Stay away from things that make you cough at work or at home.  If your cough is worse at night, try using extra pillows to raise your head up higher while you sleep.  Do not smoke, and try not to be around smoke. If you need help quitting, ask your doctor.  Do not have caffeine.  Do not drink alcohol.  Rest as needed. Contact a doctor if:  You have new problems (symptoms).  You cough up yellow fluid (pus).  Your cough does not get better after 2-3 weeks, or your cough gets worse.  Medicine does not help your cough and you are not sleeping well.  You have pain that gets worse or pain that is not helped with medicine.  You have a fever.  You are losing weight and you do not know why.  You have night sweats. Get help right away if:  You cough up blood.  You have trouble breathing.  Your heartbeat is very fast. This information is not intended to replace advice given to you by your health care provider. Make sure you discuss any questions you have with your health care provider. Document Released: 07/31/2011 Document Revised: 04/24/2016 Document Reviewed: 01/24/2015 Elsevier Interactive Patient Education  2017 [employer].     IF you received an x-ray  today, you will receive an invoice from [facility]. Please contact [facility] at [phone] with questions or concerns regarding your invoice.   IF you received labwork today, you will receive an invoice from [facility]. Please contact [facility] at [phone] with questions or concerns regarding your invoice.   Our billing staff will not be able to assist you with questions regarding bills from these companies.  You will be contacted with the lab results as soon as they are available. The fastest way to get your results is to activate your My Chart account. Instructions are located on the last page of this paperwork. If you have not heard from [place] regarding the results in 2 weeks, please contact this office.

## 2017-03-09 ENCOUNTER — Telehealth: Payer: Self-pay | Admitting: Physician Assistant

## 2017-03-09 NOTE — Telephone Encounter (Signed)
Patient needs FMLA forms completed for when she was seen for a follow up from the FLU that she was diagnosed with at the ER and [facility]. We did not write her out of any work and we did not diagnose her, I explained to the patient that I would give the paperwork to [place] to see if she would complete but that she may not. She got very rude on the phone and stated that Chelle was her PCP and that if she had any problems Chelle could call her personally about them. So I have not completed anything on the paperwork and I will place the forms in Chelle's box on 03/06/17.   If you are going to complete the forms for her then please place them in the FMLA/Disability box at the 102 checkout desk within 5-7 business days. If you do not feel like you can complete then please let me know and I will give the patient a call to explain.  Thank you!  I have also included [facility]--they will need to be labeled and scanned into her chart.

## 2017-03-10 NOTE — Telephone Encounter (Signed)
Because she came here on 02/11/2017 for re-evaluation, I can complete the forms, but I am concerned that none of the notes from the [facility], ED nor here mention time out of work. Typically, we recommend 7 days from the first day of symptoms of the flu. Her first visit for flu was 02/06/2017.  When did she first develop symptoms? What days has she missed? When did she go back?  Also, she was advised on 02/11/17 to follow-up with me once she was well from the flu and the cough (post-viral cough syndrome) to discuss evaluation of possible sleep apnea. When will she be seeing me?

## 2017-03-14 ENCOUNTER — Telehealth: Payer: Self-pay | Admitting: Physician Assistant

## 2017-03-14 NOTE — Telephone Encounter (Signed)
Sent patient a MyChart message about her FMLA forms and the information that you were needing, waiting on her reply.  Thank you

## 2017-03-23 NOTE — Telephone Encounter (Signed)
Patient is coming in on Wednesday at [place] to see Chelle about her sleep apnea and to get her FMLA forms completed.

## 2017-03-25 ENCOUNTER — Ambulatory Visit: Payer: Managed Care, Other (non HMO)

## 2017-03-25 DIAGNOSIS — Z0271 Encounter for disability determination: Secondary | ICD-10-CM

## 2017-03-30 ENCOUNTER — Ambulatory Visit: Payer: Managed Care, Other (non HMO) | Admitting: Physician Assistant

## 2017-03-30 NOTE — Telephone Encounter (Signed)
FYI- Patient was no show for today's visit

## 2017-03-30 NOTE — Telephone Encounter (Signed)
Did we get these forms completed?

## 2017-03-30 NOTE — Telephone Encounter (Signed)
Ok thank you for letting me know, I will call her and let her know that we can not complete her FMLA without an OV and if she does not want to come in then we will not complete them.

## 2017-03-30 NOTE — Telephone Encounter (Signed)
No. She rescheduled for today, 1:30 pm. Hope to complete them at the visit!

## 2017-03-31 ENCOUNTER — Ambulatory Visit (INDEPENDENT_AMBULATORY_CARE_PROVIDER_SITE_OTHER): Payer: Managed Care, Other (non HMO) | Admitting: Physician Assistant

## 2017-03-31 ENCOUNTER — Encounter: Payer: Self-pay | Admitting: Physician Assistant

## 2017-03-31 VITALS — BP 113/80 | HR 81 | Temp 98.0°F | Resp 18 | Ht 69.0 in | Wt 339.0 lb

## 2017-03-31 DIAGNOSIS — G43009 Migraine without aura, not intractable, without status migrainosus: Secondary | ICD-10-CM | POA: Diagnosis not present

## 2017-03-31 DIAGNOSIS — E559 Vitamin D deficiency, unspecified: Secondary | ICD-10-CM

## 2017-03-31 DIAGNOSIS — E668 Other obesity: Secondary | ICD-10-CM

## 2017-03-31 DIAGNOSIS — G479 Sleep disorder, unspecified: Secondary | ICD-10-CM | POA: Diagnosis not present

## 2017-03-31 DIAGNOSIS — K449 Diaphragmatic hernia without obstruction or gangrene: Secondary | ICD-10-CM | POA: Insufficient documentation

## 2017-03-31 NOTE — Assessment & Plan Note (Signed)
Appears controlled off maintenance medications, not needing triptan. Plan to resume treatment only if needed, due to feeling "bad" on the topiramate.

## 2017-03-31 NOTE — Telephone Encounter (Signed)
Paperwork scanned and faxed to [facility] in 03/31/17

## 2017-03-31 NOTE — Assessment & Plan Note (Signed)
Update level today. Resume high dose supplement if needed.

## 2017-03-31 NOTE — Assessment & Plan Note (Signed)
Refer to Sleep Medicine for evaluation. May need daytime study.

## 2017-03-31 NOTE — Patient Instructions (Signed)
     IF you received an x-ray today, you will receive an invoice from [facility]. Please contact  Radiology at [phone] with questions or concerns regarding your invoice.   IF you received labwork today, you will receive an invoice from [facility]. Please contact [facility] at [phone] with questions or concerns regarding your invoice.   Our billing staff will not be able to assist you with questions regarding bills from these companies.  You will be contacted with the lab results as soon as they are available. The fastest way to get your results is to activate your My Chart account. Instructions are located on the last page of this paperwork. If you have not heard from us regarding the results in 2 weeks, please contact this office.     

## 2017-03-31 NOTE — Assessment & Plan Note (Signed)
Refer to [facility].

## 2017-03-31 NOTE — Progress Notes (Signed)
Patient ID: Lauren Daniels, female    DOB: 02-Nov-1976, 41 y.o.   MRN: [id number]  PCP: Harrison Mons, PA-C  Chief Complaint  Patient presents with  . Follow-up    sleep apnea    Subjective:   Presents for evaluation of possible sleep apnea.  She developed illness suddenly on 02/05/2017 after having her hair done. She presented to another facility on 3/09 and was diagnosed with Influenza A. She started treatment, but that night developed SOB and experienced multiple episodes of apnea. She went back to the other facility, where she was then sent to the ED for additional evaluation.  Tamiflu was stopped, concerned for possible adverse effect, and she was advised to follow up for evaluation of OSA.  She did return to the other facility with rash on her face and swelling of her eyelids, determined to be the product used in her hair on 02/05/2017.  She had a prolonged cough, for which she was seen here 02/11/2017, and she returned to work that evening. She was given duties that were not impacted by her cough initially, and is now back to her regular work without cough.  She notes a long history of weird breathing sounds as she falls asleep, but no apnea until the Tamiflu. She has had no apnea since stopping it. She does talk in her sleep, sometimes gets up and walks and even dances. Sometimes feel well-rested, but if she dreams, she awakens feeling tired. Falls asleep easily during the day if she is still. She previously had a sleep study, but that night she slept well and it did not show any abnormalities.  From Dr. Cindra Presume note 02/11/2017: Had episode of apnea during sleep a couple nights ago. Went to ED and work-up was negative. patient believed to have component of OSA vs nasal congestion that is obstructing airway. Patient states that this has never happened before. She has gained some weight recently and noticed some new breathing sounds; unsure if actually snoring. Sleeps without  awakening. Works 3rd shift. Has daytime somnolence. Easily falls asleep when out.   She is also ready to lose weight. Has not tolerated appetite suppressants in the past. Has seen friends have bariatric surgery and then regain the weight plus more.    Review of Systems  Constitutional: Negative.   HENT: Negative for sore throat.   Eyes: Negative for visual disturbance.  Respiratory: Negative for cough, chest tightness, shortness of breath and wheezing.   Cardiovascular: Negative for chest pain and palpitations.  Gastrointestinal: Negative for abdominal pain, diarrhea, nausea and vomiting.  Genitourinary: Negative for dysuria, frequency, hematuria and urgency.  Musculoskeletal: Negative for arthralgias and myalgias.  Skin: Negative for rash.  Neurological: Negative for dizziness, weakness and headaches (no longer taking nortriptyline or tporamate, has not needed triptan).  Psychiatric/Behavioral: Positive for sleep disturbance (see HPI). Negative for decreased concentration. The patient is not nervous/anxious.      Patient Active Problem List   Diagnosis Date Noted  . Hiatal hernia 03/31/2017  . Abnormal mammogram of left breast 12/01/2014  . Migraine 10/19/2014  . Appetite disorder 10/13/2013  . Extreme obesity 10/13/2013  . Back spasm 04/14/2013  . Fibroid   . Vitamin D deficiency   . HSV-2 (herpes simplex virus 2) infection     Prior to Admission medications   Medication Sig Start Date End Date Authorizing Provider  Iron Polysacch Cmplx-B12-FA (FERREX 150 FORTE) 150-0.025-1 MG CAPS Take 1 capsule by mouth daily.  10/13/13  Historical  Provider, MD  SUMAtriptan (IMITREX) 100 MG tablet Take 1 tablet (100 mg total) by mouth 2 (two) times daily as needed for migraine. Patient not taking: Reported on 11/17/2015 01/31/15  Kathrynn Ducking, MD  Vitamin D, Ergocalciferol, (DRISDOL) 50000 UNITS CAPS capsule Take 50,000 Units by mouth every Sunday. Reported on 02/14/2016   Historical  Provider, MD       Allergies  Allergen Reactions  . Eye Drops [Naphazoline-Polyethyl Glycol] Swelling       Objective:  Physical Exam  Constitutional: She is oriented to person, place, and time. She appears well-developed and well-nourished. She is active and cooperative. No distress.  BP 113/80   Pulse 81   Temp 98 F (36.7 C) (Oral)   Resp 18   Ht 5\' 9"  (1.753 m)   Wt (!) 339 lb (153.8 kg)   LMP 03/20/2017   SpO2 99%   BMI 50.06 kg/m   HENT:  Head: Normocephalic and atraumatic.  Right Ear: Hearing normal.  Left Ear: Hearing normal.  Eyes: Conjunctivae are normal. No scleral icterus.  Neck: Normal range of motion. Neck supple. No thyromegaly present.  Cardiovascular: Normal rate, regular rhythm and normal heart sounds.   Pulses:      Radial pulses are 2+ on the right side, and 2+ on the left side.  Pulmonary/Chest: Effort normal and breath sounds normal.  Lymphadenopathy:       Head (right side): No tonsillar, no preauricular, no posterior auricular and no occipital adenopathy present.       Head (left side): No tonsillar, no preauricular, no posterior auricular and no occipital adenopathy present.    She has no cervical adenopathy.       Right: No supraclavicular adenopathy present.       Left: No supraclavicular adenopathy present.  Neurological: She is alert and oriented to person, place, and time. No sensory deficit.  Skin: Skin is warm, dry and intact. No rash noted. No cyanosis or erythema. Nails show no clubbing.  Psychiatric: She has a normal mood and affect. Her speech is normal and behavior is normal.    Wt Readings from Last 3 Encounters:  03/31/17 (!) 339 lb (153.8 kg)  02/11/17 (!) 344 lb 6.4 oz (156.2 kg)  02/14/16 (!) 328 lb 12.8 oz (149.1 kg)           Assessment & Plan:   Problem List Items Addressed This Visit    Migraine (Chronic)    Appears controlled off maintenance medications, not needing triptan. Plan to resume treatment only if  needed, due to feeling "bad" on the topiramate.      Vitamin D deficiency    Update level today. Resume high dose supplement if needed.      Relevant Orders   VITAMIN D 25 Hydroxy (Vit-D Deficiency, Fractures)   Extreme obesity - Primary    Refer to [facility].      Relevant Orders   CBC with Differential/Platelet   Amb Ref to [facility]   Sleep disturbance    Refer to Sleep Medicine for evaluation. May need daytime study.      Relevant Orders   Ambulatory referral to Sleep Studies       Return in about 6 months (around 10/01/2017) for re-evaluation of vitamin D.   Fara Chute, PA-C [facility] [place]

## 2017-04-01 LAB — CBC WITH DIFFERENTIAL/PLATELET
BASOS ABS: 0 10*3/uL (ref 0.0–0.2)
Basos: 0 %
EOS (ABSOLUTE): 0.1 10*3/uL (ref 0.0–0.4)
Eos: 2 %
HEMOGLOBIN: 11.9 g/dL (ref 11.1–15.9)
Hematocrit: 38.8 % (ref 34.0–46.6)
IMMATURE GRANS (ABS): 0 10*3/uL (ref 0.0–0.1)
Immature Granulocytes: 0 %
LYMPHS: 51 %
Lymphocytes Absolute: 4 10*3/uL — ABNORMAL HIGH (ref 0.7–3.1)
MCH: 24.7 pg — ABNORMAL LOW (ref 26.6–33.0)
MCHC: 30.7 g/dL — ABNORMAL LOW (ref 31.5–35.7)
MCV: 81 fL (ref 79–97)
MONOCYTES: 6 %
Monocytes Absolute: 0.5 10*3/uL (ref 0.1–0.9)
NEUTROS ABS: 3.3 10*3/uL (ref 1.4–7.0)
Neutrophils: 41 %
Platelets: 448 10*3/uL — ABNORMAL HIGH (ref 150–379)
RBC: 4.82 x10E6/uL (ref 3.77–5.28)
RDW: 16.3 % — ABNORMAL HIGH (ref 12.3–15.4)
WBC: 7.9 10*3/uL (ref 3.4–10.8)

## 2017-04-01 LAB — VITAMIN D 25 HYDROXY (VIT D DEFICIENCY, FRACTURES): VIT D 25 HYDROXY: 21.3 ng/mL — AB (ref 30.0–100.0)

## 2017-04-16 ENCOUNTER — Encounter (INDEPENDENT_AMBULATORY_CARE_PROVIDER_SITE_OTHER): Payer: Managed Care, Other (non HMO) | Admitting: Family Medicine

## 2017-04-29 ENCOUNTER — Ambulatory Visit: Payer: Managed Care, Other (non HMO) | Admitting: Podiatry

## 2017-05-11 ENCOUNTER — Institutional Professional Consult (permissible substitution): Payer: Managed Care, Other (non HMO) | Admitting: Neurology

## 2017-05-12 ENCOUNTER — Encounter: Payer: Self-pay | Admitting: Neurology

## 2017-05-13 ENCOUNTER — Ambulatory Visit (INDEPENDENT_AMBULATORY_CARE_PROVIDER_SITE_OTHER): Payer: Managed Care, Other (non HMO) | Admitting: Family Medicine

## 2017-05-13 ENCOUNTER — Encounter (INDEPENDENT_AMBULATORY_CARE_PROVIDER_SITE_OTHER): Payer: Self-pay

## 2017-05-14 ENCOUNTER — Ambulatory Visit: Payer: Managed Care, Other (non HMO) | Admitting: Podiatry

## 2017-05-20 ENCOUNTER — Telehealth: Payer: Self-pay | Admitting: Physician Assistant

## 2017-05-20 NOTE — Telephone Encounter (Signed)
Spoke with [facility] yesterday and they said they would be happy to see this pt for the sleep study. I let them know that we were looking to have the pt evaluated to see what kind of sleep test she would need to have ordered. They said they would initially see her and ask a series of questions about everything concerning her sleeping that would help them determine what test she needs and then they could order the test and schedule it. They said once they ordered the test this would be where they would evaluate her while she sleeps. Is this what we are wanting the pt to have done or should we look at referring somewhere else? Thanks!

## 2017-05-20 NOTE — Telephone Encounter (Signed)
This is EXACTLY what I was looking for. Thank you!!!

## 2017-05-22 NOTE — Telephone Encounter (Signed)
Referral sent on 6/22. Thank you!

## 2017-05-26 ENCOUNTER — Encounter (INDEPENDENT_AMBULATORY_CARE_PROVIDER_SITE_OTHER): Payer: Self-pay | Admitting: Family Medicine

## 2017-05-26 ENCOUNTER — Ambulatory Visit (INDEPENDENT_AMBULATORY_CARE_PROVIDER_SITE_OTHER): Payer: Managed Care, Other (non HMO) | Admitting: Family Medicine

## 2017-05-26 VITALS — BP 115/77 | HR 104 | Temp 98.1°F | Ht 70.0 in | Wt 328.0 lb

## 2017-05-26 DIAGNOSIS — E669 Obesity, unspecified: Secondary | ICD-10-CM | POA: Diagnosis not present

## 2017-05-26 DIAGNOSIS — Z6841 Body Mass Index (BMI) 40.0 and over, adult: Secondary | ICD-10-CM

## 2017-05-26 DIAGNOSIS — Z9189 Other specified personal risk factors, not elsewhere classified: Secondary | ICD-10-CM | POA: Diagnosis not present

## 2017-05-26 DIAGNOSIS — IMO0001 Reserved for inherently not codable concepts without codable children: Secondary | ICD-10-CM

## 2017-05-26 DIAGNOSIS — Z0289 Encounter for other administrative examinations: Secondary | ICD-10-CM

## 2017-05-26 DIAGNOSIS — R0683 Snoring: Secondary | ICD-10-CM

## 2017-05-26 DIAGNOSIS — R5383 Other fatigue: Secondary | ICD-10-CM

## 2017-05-26 DIAGNOSIS — R0602 Shortness of breath: Secondary | ICD-10-CM

## 2017-05-26 DIAGNOSIS — Z1331 Encounter for screening for depression: Secondary | ICD-10-CM

## 2017-05-26 DIAGNOSIS — E559 Vitamin D deficiency, unspecified: Secondary | ICD-10-CM | POA: Diagnosis not present

## 2017-05-26 DIAGNOSIS — Z1389 Encounter for screening for other disorder: Secondary | ICD-10-CM

## 2017-05-26 NOTE — Progress Notes (Signed)
Office: [phone]  /  Fax: [phone]   HPI:   Chief Complaint: OBESITY  Lauren Daniels (MR# [id number]) is a 41 y.o. female who presents on 05/26/2017 for obesity evaluation and treatment. Current BMI is Body mass index is 47.06 kg/m.Lauren Daniels has struggled with obesity for years and has been unsuccessful in either losing weight or maintaining long term weight loss. [place] attended our information session and states she is currently in the action stage of change and ready to dedicate time achieving and maintaining a healthier weight. Patient has taken Qsymia in the past but does not want to be on meds. Gordon states her desired weight loss is 220 lbs.  she has been heavy most of  her life she has significant food cravings issues  she snacks frequently in the evenings she wakes up frquently in the middle of the night to eat she skips meals frequently she is frequently drinking liquids with calories she frequently makes poor food choices she has problems with excessive hunger  she frequently eats larger portions than normal  she struggles with emotional eating    Lauren Daniels feels her energy is lower than it should be. This has worsened with weight gain and has worsened recently. Lauren Daniels admits to daytime somnolence and  admits to waking up still tired. Patient is at risk for obstructive sleep apnea. Patent has a history of symptoms of morning Lauren and morning headache. Patient generally gets 6 hours of sleep per night, and states they generally have nightime awakenings. Snoring is present. Apneic episodes is not present. Epworth Sleepiness Score is 24.  Dyspnea on exertion Lauren Daniels notes increasing shortness of breath with exercising and seems to be worsening over time with weight gain. She notes getting out of breath sooner with activity than she used to. This has gotten worse recently. Lauren Daniels denies orthopnea.  Vitamin D deficiency Lauren Daniels has a diagnosis of  vitamin D deficiency. She is not currently taking vit D and denies nausea, vomiting or muscle weakness.  Snoring Patient is unable to get a sleep study done but does have a lot of symptoms of sleep apnea. Witnessed apnea plus snoring to include significant somnolence even with eight hours+ sleep.    Depression Screen Lauren Daniels's Food and Mood (modified PHQ-9) score was  Depression screen PHQ 2/9 05/26/2017  Decreased Interest 2  Down, Depressed, Hopeless 2  PHQ - 2 Score 4  Altered sleeping 3  Tired, decreased energy 3  Change in appetite 2  Feeling bad or failure about yourself  2  Trouble concentrating 2  Moving slowly or fidgety/restless 0  Suicidal thoughts 0  PHQ-9 Score 16   At risk for diabetes Lauren Daniels is at higher than averagerisk for developing diabetes due to her obesity. She currently denies polyuria or polydipsia.  ALLERGIES: Allergies  Allergen Reactions  . Eye Drops [Naphazoline-Polyethyl Glycol] Swelling    MEDICATIONS: Current Outpatient Prescriptions on File Prior to Visit  Medication Sig Dispense Refill  . Iron Polysacch Cmplx-B12-FA (FERREX 150 FORTE) 150-0.025-1 MG CAPS Take 1 capsule by mouth daily.     . SUMAtriptan (IMITREX) 100 MG tablet Take 1 tablet (100 mg total) by mouth 2 (two) times daily as needed for migraine. (Patient not taking: Reported on 11/17/2015) 10 tablet 2  . Vitamin D, Ergocalciferol, (DRISDOL) 50000 UNITS CAPS capsule Take 50,000 Units by mouth every Sunday. Reported on 02/14/2016     No current facility-administered medications on file prior to visit.     PAST MEDICAL HISTORY: Past  Medical History:  Diagnosis Date  . Allergy   . Anemia   . Back spasm   . Depression   . Fibroid   . HSV-2 (herpes simplex virus 2) infection   . Joint pain   . Knee problem   . Migraine 10/19/2014  . Obesity   . Shortness of breath   . Sleep apnea   . Vitamin D deficiency   . Vitamin deficiency    HX OF LOW VIT D     PAST SURGICAL  HISTORY: Past Surgical History:  Procedure Laterality Date  . TUMOR REMOVAL     fibroid    SOCIAL HISTORY: Social History  Substance Use Topics  . Smoking status: Never Smoker  . Smokeless tobacco: Never Used  . Alcohol use No    FAMILY HISTORY: Family History  Problem Relation Age of Onset  . Breast cancer Mother 45  . Thyroid disease Mother   . Obesity Mother   . Breast cancer Maternal Grandmother 58  . Colon cancer Maternal Grandmother   . Diabetes Father   . Heart disease Father   . Hyperlipidemia Father   . Hypertension Father   . Sleep apnea Father   . Alcoholism Father   . Obesity Father   . Breast cancer Maternal Aunt   . Breast cancer Maternal Aunt   . Migraines Son   . Migraines Brother     ROS: Review of Systems  Constitutional: Positive for malaise/Lauren.       Trouble Sleeping  HENT: Positive for sinus pain.        Headache Stuffiness  Eyes:       Wear Glasses  Cardiovascular:       Shortness of breath  Gastrointestinal: Positive for nausea.  Musculoskeletal: Positive for back pain and joint pain.       Joint pain  Skin: Positive for itching.  Endo/Heme/Allergies:       Polyuria  All other systems reviewed and are negative.   PHYSICAL EXAM: Blood pressure 115/77, pulse (!) 104, temperature 98.1 F (36.7 C), temperature source Oral, height _0  (1.778 m), weight (!) 328 lb (148.8 kg), SpO2 98 %. Body mass index is 47.06 kg/m. Physical Exam  Constitutional: She is oriented to person, place, and time. She appears well-developed and well-nourished.  HENT:  Head: Normocephalic and atraumatic.  Eyes: Pupils are equal, round, and reactive to light.  Neck: Normal range of motion. Neck supple.  Cardiovascular: Normal rate and regular rhythm.   Pulmonary/Chest: Effort normal and breath sounds normal.  Abdominal: Soft. Bowel sounds are normal.  Musculoskeletal: Normal range of motion.  Neurological: She is alert and oriented to person,  place, and time.  Skin: Skin is warm and dry.  Psychiatric: She has a normal mood and affect. Her behavior is normal.  Vitals reviewed.   RECENT LABS AND TESTS: BMET    Component Value Date/Time   NA 136 04/14/2013 1217   K 4.1 04/14/2013 1217   CL 104 04/14/2013 1217   CO2 25 04/14/2013 1217   GLUCOSE 69 (L) 04/14/2013 1217   BUN 8 04/14/2013 1217   CREATININE 0.70 04/14/2013 1217   CALCIUM 9.5 04/14/2013 1217   GFRNONAA >60 09/13/2009 0930   GFRAA  09/13/2009 0930    >60        The eGFR has been calculated using the MDRD equation. This calculation has not been validated in all clinical situations. eGFR's persistently <60 mL/min signify possible Chronic Kidney Disease.   Lab  Results  Component Value Date   HGBA1C 5.6 12/01/2014   No results found for: INSULIN CBC    Component Value Date/Time   WBC 7.9 03/31/2017 1342   WBC 6.2 12/01/2014 1408   WBC 6.7 04/14/2013 1217   RBC 4.82 03/31/2017 1342   RBC 4.94 12/01/2014 1408   RBC 4.61 04/14/2013 1217   HGB 11.9 03/31/2017 1342   HCT 38.8 03/31/2017 1342   PLT 448 (H) 03/31/2017 1342   MCV 81 03/31/2017 1342   MCH 24.7 (L) 03/31/2017 1342   MCH 25.1 (A) 12/01/2014 1408   MCH 25.2 (L) 04/14/2013 1217   MCHC 30.7 (L) 03/31/2017 1342   MCHC 30.8 (A) 12/01/2014 1408   MCHC 32.5 04/14/2013 1217   RDW 16.3 (H) 03/31/2017 1342   LYMPHSABS 4.0 (H) 03/31/2017 1342   MONOABS 0.4 04/14/2013 1217   EOSABS 0.1 03/31/2017 1342   BASOSABS 0.0 03/31/2017 1342   Iron/TIBC/Ferritin/ %Sat No results found for: IRON, TIBC, FERRITIN, IRONPCTSAT Lipid Panel     Component Value Date/Time   CHOL 154 04/14/2013 1217   TRIG 67 04/14/2013 1217   HDL 49 04/14/2013 1217   CHOLHDL 3.1 04/14/2013 1217   VLDL 13 04/14/2013 1217   LDLCALC 92 04/14/2013 1217   Hepatic Function Panel     Component Value Date/Time   PROT 7.0 04/14/2013 1217   ALBUMIN 4.2 04/14/2013 1217   AST 14 04/14/2013 1217   ALT 9 04/14/2013 1217    ALKPHOS 56 04/14/2013 1217   BILITOT 0.4 04/14/2013 1217      Component Value Date/Time   TSH 2.211 04/14/2013 1217   TSH 1.683 10/15/2012 0938    ECG  shows NSR with a rate of 79. INDIRECT CALORIMETER done today shows a VO2 of 293 and a REE of 2043.    ASSESSMENT AND PLAN: Other Lauren - Plan: EKG 12-Lead, Comprehensive metabolic panel, CBC with Differential/Platelet, Hemoglobin A1c, Insulin, random, Lipid Panel With LDL/HDL Ratio, Vitamin B12, Folate, TSH, T4, free, T3  Shortness of breath on exertion  Vitamin D deficiency - Plan: VITAMIN D 25 Hydroxy (Vit-D Deficiency, Fractures)  Snoring  Depression screening  At risk for heart disease  Morbid obesity (HCC)  PLAN:  Lauren Emmerie was informed that her Lauren may be related to obesity, depression or many other causes. Labs will be ordered, and in the meanwhile Roanne has agreed to work on diet, exercise and weight loss to help with Lauren. Proper sleep hygiene was discussed including the need for 7-8 hours of quality sleep each night. A sleep study was not ordered based on symptoms and Epworth score.  Dyspnea on exertion Analeigha's shortness of breath appears to be obesity related and exercise induced. She has agreed to work on weight loss and gradually increase exercise to treat her exercise induced shortness of breath. If Mark follows our instructions and loses weight without improvement of her shortness of breath, we will plan to refer to pulmonology. We will monitor this condition regularly. Solveig agrees to this plan.  Vitamin D Deficiency Niana was informed that low vitamin D levels contributes to Lauren and are associated with obesity, breast, and colon cancer. She will not be started on Vitamin D but will follow up for routine testing of vitamin D, at least 2-3 times per year. She was informed of the risk of over-replacement of vitamin D and agrees to not increase her dose unless he discusses this  with [place] first.  Snoring Encouraged the patient to reschedule her  sleep study as she is at high risk of sleep apnea and other complications such as heart disease.   Depression Screen Aeva had a moderately positive depression screening. Depression is commonly associated with obesity and often results in emotional eating behaviors. We will monitor this closely and work on CBT to help improve the non-hunger eating patterns. Referral to Psychology may be required if no improvement is seen as she continues in our clinic.  Diabetes risk counselling Ayris was given extended (at least 15 minutes) diabetes prevention counseling today. She is 41 y.o. female and has risk factors for diabetes including obesity. We discussed intensive lifestyle modifications today with an emphasis on weight loss as well as increasing exercise and decreasing simple carbohydrates in her diet.   Obesity Nikko is currently in the action stage of change and her goal is to continue with weight loss efforts She has agreed to follow the Category 3 plan Milanie has been instructed to work up to a goal of 150 minutes of combined cardio and strengthening exercise per week for weight loss and overall health benefits. We discussed the following Behavioral Modification Stratagies today: increasing lean protein intake, increasing vegetables, work on meal planning and easy cooking plan, dealing with family or coworker sabotage, and planning for success.   Tomasita has agreed to follow up with our clinic in 2 weeks. She was informed of the importance of frequent follow up visits to maximize her success with intensive lifestyle modifications for her multiple health conditions. She was informed we would discuss her lab results at her next visit unless there is a critical issue that needs to be addressed sooner. Naara agreed to keep her next visit at the agreed upon time to discuss these results.  I, April Moore , am acting as  Location manager for Dennard Nip, MD  I have reviewed the above documentation for accuracy and completeness, and I agree with the above. -Dennard Nip, MD

## 2017-05-27 ENCOUNTER — Ambulatory Visit: Payer: Managed Care, Other (non HMO) | Admitting: Podiatry

## 2017-05-27 LAB — COMPREHENSIVE METABOLIC PANEL
ALBUMIN: 4.2 g/dL (ref 3.5–5.5)
ALT: 11 IU/L (ref 0–32)
AST: 18 IU/L (ref 0–40)
Albumin/Globulin Ratio: 1.4 (ref 1.2–2.2)
Alkaline Phosphatase: 59 IU/L (ref 39–117)
BUN / CREAT RATIO: 9 (ref 9–23)
BUN: 7 mg/dL (ref 6–24)
Bilirubin Total: 0.3 mg/dL (ref 0.0–1.2)
CALCIUM: 9.6 mg/dL (ref 8.7–10.2)
CO2: 24 mmol/L (ref 20–29)
CREATININE: 0.77 mg/dL (ref 0.57–1.00)
Chloride: 102 mmol/L (ref 96–106)
GFR, EST AFRICAN AMERICAN: 112 mL/min/{1.73_m2} (ref >59)
GFR, EST NON AFRICAN AMERICAN: 97 mL/min/{1.73_m2} (ref >59)
GLOBULIN, TOTAL: 3.1 g/dL (ref 1.5–4.5)
Glucose: 79 mg/dL (ref 65–99)
Potassium: 5 mmol/L (ref 3.5–5.2)
SODIUM: 139 mmol/L (ref 134–144)
Total Protein: 7.3 g/dL (ref 6.0–8.5)

## 2017-05-27 LAB — CBC WITH DIFFERENTIAL/PLATELET
BASOS: 0 %
Basophils Absolute: 0 10*3/uL (ref 0.0–0.2)
EOS (ABSOLUTE): 0.1 10*3/uL (ref 0.0–0.4)
EOS: 2 %
HEMATOCRIT: 37.9 % (ref 34.0–46.6)
Hemoglobin: 11.9 g/dL (ref 11.1–15.9)
IMMATURE GRANULOCYTES: 0 %
Immature Grans (Abs): 0 10*3/uL (ref 0.0–0.1)
LYMPHS ABS: 3.5 10*3/uL — AB (ref 0.7–3.1)
Lymphs: 57 %
MCH: 24.3 pg — ABNORMAL LOW (ref 26.6–33.0)
MCHC: 31.4 g/dL — AB (ref 31.5–35.7)
MCV: 78 fL — AB (ref 79–97)
MONOS ABS: 0.5 10*3/uL (ref 0.1–0.9)
Monocytes: 8 %
NEUTROS ABS: 2 10*3/uL (ref 1.4–7.0)
Neutrophils: 33 %
Platelets: 436 10*3/uL — ABNORMAL HIGH (ref 150–379)
RBC: 4.89 x10E6/uL (ref 3.77–5.28)
RDW: 16 % — ABNORMAL HIGH (ref 12.3–15.4)
WBC: 6 10*3/uL (ref 3.4–10.8)

## 2017-05-27 LAB — VITAMIN B12: Vitamin B-12: 1074 pg/mL (ref 232–1245)

## 2017-05-27 LAB — HEMOGLOBIN A1C
Est. average glucose Bld gHb Est-mCnc: 131 mg/dL
Hgb A1c MFr Bld: 6.2 % — ABNORMAL HIGH (ref 4.8–5.6)

## 2017-05-27 LAB — LIPID PANEL WITH LDL/HDL RATIO
Cholesterol, Total: 154 mg/dL (ref 100–199)
HDL: 50 mg/dL (ref >39)
LDL Calculated: 85 mg/dL (ref 0–99)
LDl/HDL Ratio: 1.7 ratio (ref 0.0–3.2)
Triglycerides: 94 mg/dL (ref 0–149)
VLDL CHOLESTEROL CAL: 19 mg/dL (ref 5–40)

## 2017-05-27 LAB — INSULIN, RANDOM: INSULIN: 13.9 u[IU]/mL (ref 2.6–24.9)

## 2017-05-27 LAB — T4, FREE: FREE T4: 1.26 ng/dL (ref 0.82–1.77)

## 2017-05-27 LAB — TSH: TSH: 1.69 u[IU]/mL (ref 0.450–4.500)

## 2017-05-27 LAB — T3: T3, Total: 125 ng/dL (ref 71–180)

## 2017-05-27 LAB — VITAMIN D 25 HYDROXY (VIT D DEFICIENCY, FRACTURES): Vit D, 25-Hydroxy: 21.7 ng/mL — ABNORMAL LOW (ref 30.0–100.0)

## 2017-05-27 LAB — FOLATE: Folate: 19.5 ng/mL (ref >3.0)

## 2017-06-09 ENCOUNTER — Ambulatory Visit (INDEPENDENT_AMBULATORY_CARE_PROVIDER_SITE_OTHER): Payer: Managed Care, Other (non HMO) | Admitting: Family Medicine

## 2017-06-09 ENCOUNTER — Encounter (INDEPENDENT_AMBULATORY_CARE_PROVIDER_SITE_OTHER): Payer: Self-pay

## 2017-06-10 ENCOUNTER — Ambulatory Visit (INDEPENDENT_AMBULATORY_CARE_PROVIDER_SITE_OTHER): Payer: Managed Care, Other (non HMO) | Admitting: Family Medicine

## 2017-06-12 ENCOUNTER — Encounter (INDEPENDENT_AMBULATORY_CARE_PROVIDER_SITE_OTHER): Payer: Self-pay | Admitting: Family Medicine

## 2017-06-13 ENCOUNTER — Encounter (INDEPENDENT_AMBULATORY_CARE_PROVIDER_SITE_OTHER): Payer: Self-pay | Admitting: Family Medicine

## 2017-06-15 NOTE — Telephone Encounter (Signed)
Please advise 

## 2017-06-16 ENCOUNTER — Ambulatory Visit (INDEPENDENT_AMBULATORY_CARE_PROVIDER_SITE_OTHER): Payer: Managed Care, Other (non HMO) | Admitting: Family Medicine

## 2017-06-17 ENCOUNTER — Encounter: Payer: Self-pay | Admitting: Pulmonary Disease

## 2017-06-17 ENCOUNTER — Ambulatory Visit (INDEPENDENT_AMBULATORY_CARE_PROVIDER_SITE_OTHER): Payer: Managed Care, Other (non HMO) | Admitting: Pulmonary Disease

## 2017-06-17 VITALS — BP 122/82 | HR 109 | Ht 70.0 in | Wt 332.6 lb

## 2017-06-17 DIAGNOSIS — G4710 Hypersomnia, unspecified: Secondary | ICD-10-CM

## 2017-06-17 DIAGNOSIS — G4750 Parasomnia, unspecified: Secondary | ICD-10-CM | POA: Diagnosis not present

## 2017-06-17 NOTE — Patient Instructions (Signed)
Will arrange for in lab sleep study Will call to arrange for follow up after sleep study reviewed  

## 2017-06-17 NOTE — Progress Notes (Signed)
   Subjective:    Patient ID: Lauren Daniels, female    DOB: 12/11/1975, 41 y.o.   MRN: [id number]  HPI    Review of Systems  Constitutional: Positive for unexpected weight change. Negative for fever.  HENT: Negative for congestion, dental problem, ear pain, nosebleeds, postnasal drip, rhinorrhea, sinus pressure, sneezing, sore throat and trouble swallowing.   Eyes: Negative for redness and itching.  Respiratory: Negative for cough, chest tightness, shortness of breath and wheezing.   Cardiovascular: Positive for palpitations ( irregular heartbeat). Negative for leg swelling.  Gastrointestinal: Negative for nausea and vomiting.  Genitourinary: Negative for dysuria.  Musculoskeletal: Negative for joint swelling.  Skin: Negative for rash.  Neurological: Negative for headaches.  Hematological: Does not bruise/bleed easily.  Psychiatric/Behavioral: Negative for dysphoric mood. The patient is not nervous/anxious.        Objective:   Physical Exam        Assessment & Plan:

## 2017-06-17 NOTE — Progress Notes (Signed)
Past Surgical History Lauren Daniels  has a past surgical history that includes Tumor removal and Vaginal delivery.  Allergies  Allergen Reactions  . Eye Drops [Naphazoline-Polyethyl Glycol] Swelling    Family History Her family history includes Alcoholism in her father; Breast cancer in her maternal aunt and maternal aunt; Breast cancer (age of onset: 13) in her maternal grandmother; Breast cancer (age of onset: 41) in her mother; Colon cancer in her maternal grandmother; Diabetes in her father; Heart disease in her father; Hyperlipidemia in her father; Hypertension in her father; Migraines in her brother and son; Obesity in her father and mother; Sleep apnea in her father; Thyroid disease in her mother.  Social History Lauren Daniels  reports that Lauren Daniels has never smoked. Lauren Daniels has never used smokeless tobacco. Lauren Daniels reports that Lauren Daniels does not drink alcohol or use drugs.  Review of systems Constitutional: Positive for unexpected weight change. Negative for fever.  HENT: Negative for congestion, dental problem, ear pain, nosebleeds, postnasal drip, rhinorrhea, sinus pressure, sneezing, sore throat and trouble swallowing.   Eyes: Negative for redness and itching.  Respiratory: Negative for cough, chest tightness, shortness of breath and wheezing.   Cardiovascular: Positive for palpitations ( irregular heartbeat). Negative for leg swelling.  Gastrointestinal: Negative for nausea and vomiting.  Genitourinary: Negative for dysuria.  Musculoskeletal: Negative for joint swelling.  Skin: Negative for rash.  Neurological: Negative for headaches.  Hematological: Does not bruise/bleed easily.  Psychiatric/Behavioral: Negative for dysphoric mood. The patient is not nervous/anxious.     Current Outpatient Prescriptions on File Prior to Visit  Medication Sig  . SUMAtriptan (IMITREX) 100 MG tablet Take 1 tablet (100 mg total) by mouth 2 (two) times daily as needed for migraine.   No current facility-administered medications  on file prior to visit.     Chief Complaint  Patient presents with  . SLEEP CONSULT    Referred by Dr Daphane Shepherd. Sleep study about 20 years ago. Epworth Score:  22    Past medical history Lauren Daniels  has a past medical history of Allergy; Anemia; Back spasm; Depression; Fibroid; HSV-2 (herpes simplex virus 2) infection; Joint pain; Knee problem; Migraine (10/19/2014); Obesity; Shortness of breath; Sleep apnea; Vitamin D deficiency; and Vitamin deficiency.  Vital signs BP 122/82 (BP Location: Left Arm, Cuff Size: Normal)   Pulse (!) 109   Ht 5\' 10"  (1.778 m)   Wt (!) 332 lb 9.6 oz (150.9 kg)   SpO2 97%   BMI 47.72 kg/m   History of Present Illness Lauren Daniels is a 41 y.o. female for evaluation of sleep problems.  Lauren Daniels has noticed trouble with her sleep for years.  This has been getting worse.  Lauren Daniels will be asleep and then do different activities or talk to her family.  Lauren Daniels is not aware of these events and Lauren Daniels is difficult to arouse from sleep.  Lauren Daniels had a sleep study years ago, but was told this didn't show anything.  Lauren Daniels makes funny noises with her breathing while asleep.  Lauren Daniels is tired all the time.  Lauren Daniels goes to sleep at 8 am.  Lauren Daniels falls asleep after few minutes.  Lauren Daniels wakes up 1 or 2 times to use the bathroom.  Lauren Daniels gets out of bed at 9 pm.  Lauren Daniels works third shift.  Lauren Daniels can sleep anytime.  Lauren Daniels denies morning headache.  Lauren Daniels does not use anything to help her fall sleep or stay awake.  Lauren Daniels denies sleep walking, sleep talking, bruxism, or nightmares.  There is no  history of restless legs.  Lauren Daniels denies sleep hallucinations, sleep paralysis, or cataplexy.  The Epworth score is 22 out of 24.   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  I am concerned this could be related to sleep apnea.  Plan:  Arrange for in lab sleep study   Patient Instructions  Will arrange for in lab sleep study Will call to arrange for follow up after sleep study reviewed     Chesley Mires, M.D. Pager [phone] 06/17/2017, 4:10 PM

## 2017-06-18 ENCOUNTER — Ambulatory Visit (INDEPENDENT_AMBULATORY_CARE_PROVIDER_SITE_OTHER): Payer: Managed Care, Other (non HMO) | Admitting: Family Medicine

## 2017-07-28 ENCOUNTER — Other Ambulatory Visit: Payer: Self-pay | Admitting: Pulmonary Disease

## 2017-07-28 DIAGNOSIS — R29818 Other symptoms and signs involving the nervous system: Secondary | ICD-10-CM

## 2017-08-16 ENCOUNTER — Encounter (HOSPITAL_BASED_OUTPATIENT_CLINIC_OR_DEPARTMENT_OTHER): Payer: Self-pay

## 2017-09-02 ENCOUNTER — Ambulatory Visit: Payer: Self-pay | Admitting: Pulmonary Disease

## 2017-10-02 ENCOUNTER — Ambulatory Visit: Payer: Managed Care, Other (non HMO) | Admitting: Physician Assistant

## 2017-10-11 ENCOUNTER — Other Ambulatory Visit: Payer: Self-pay

## 2017-10-11 ENCOUNTER — Emergency Department (HOSPITAL_COMMUNITY)
Admission: EM | Admit: 2017-10-11 | Discharge: 2017-10-11 | Disposition: A | Discharge status: Home / Self Care | Payer: Managed Care, Other (non HMO) | Attending: Emergency Medicine | Admitting: Emergency Medicine

## 2017-10-11 ENCOUNTER — Encounter (HOSPITAL_COMMUNITY): Payer: Self-pay | Admitting: *Deleted

## 2017-10-11 DIAGNOSIS — S90861A Insect bite (nonvenomous), right foot, initial encounter: Secondary | ICD-10-CM | POA: Insufficient documentation

## 2017-10-11 DIAGNOSIS — W57XXXA Bitten or stung by nonvenomous insect and other nonvenomous arthropods, initial encounter: Secondary | ICD-10-CM | POA: Insufficient documentation

## 2017-10-11 DIAGNOSIS — Y999 Unspecified external cause status: Secondary | ICD-10-CM | POA: Insufficient documentation

## 2017-10-11 DIAGNOSIS — S40862A Insect bite (nonvenomous) of left upper arm, initial encounter: Secondary | ICD-10-CM | POA: Insufficient documentation

## 2017-10-11 DIAGNOSIS — Y929 Unspecified place or not applicable: Secondary | ICD-10-CM | POA: Insufficient documentation

## 2017-10-11 DIAGNOSIS — Z79899 Other long term (current) drug therapy: Secondary | ICD-10-CM | POA: Insufficient documentation

## 2017-10-11 DIAGNOSIS — S30860A Insect bite (nonvenomous) of lower back and pelvis, initial encounter: Secondary | ICD-10-CM | POA: Insufficient documentation

## 2017-10-11 DIAGNOSIS — Y939 Activity, unspecified: Secondary | ICD-10-CM | POA: Insufficient documentation

## 2017-10-11 MED ORDER — FAMOTIDINE 20 MG PO TABS
20.0000 mg | ORAL_TABLET | Freq: Once | ORAL | Status: AC
  Administered 2017-10-11: 20 mg via ORAL
  Filled 2017-10-11: qty 1

## 2017-10-11 MED ORDER — DOXYCYCLINE HYCLATE 100 MG PO TABS
100.0000 mg | ORAL_TABLET | Freq: Once | ORAL | Status: AC
  Administered 2017-10-11: 100 mg via ORAL
  Filled 2017-10-11: qty 1

## 2017-10-11 MED ORDER — DOXYCYCLINE HYCLATE 100 MG PO CAPS: 100.0000 mg | ORAL_CAPSULE | Freq: Two times a day (BID) | ORAL | 0 refills | Status: DC

## 2017-10-11 MED ORDER — DEXAMETHASONE SODIUM PHOSPHATE 10 MG/ML IJ SOLN
10.0000 mg | Freq: Once | INTRAMUSCULAR | Status: AC
  Administered 2017-10-11: 10 mg via INTRAMUSCULAR
  Filled 2017-10-11: qty 1

## 2017-10-11 MED ORDER — FAMOTIDINE 20 MG PO TABS: 20.0000 mg | ORAL_TABLET | Freq: Two times a day (BID) | ORAL | 0 refills | Status: DC

## 2017-10-11 NOTE — ED Provider Notes (Signed)
[facility] DEPT Provider Note   CSN: [id number] Arrival date & time: 10/11/17  2127     History   Chief Complaint Chief Complaint  Patient presents with  . Allergic Reaction    ? insect bites    HPI Lauren Daniels is a 41 y.o. female.  Patient presents to the emergency department with a chief complaint of insect bites.  She states that she believes she was bitten by insects yesterday.  She has bite marks on her left upper arm, left low back, and right foot.  She states that the bites are itchy and painful.  She has taken Benadryl with some relief.  She denies any fever, chills, shortness of breath, throat swelling sensation, nausea, vomiting, or diarrhea.  She denies ever having had anything like this before.  She denies any other associated symptoms.  There are no modifying factors.  She is concerned about infection.   The history is provided by the patient. No language interpreter was used.    Past Medical History:  Diagnosis Date  . Allergy   . Anemia   . Back spasm   . Depression   . Fibroid   . HSV-2 (herpes simplex virus 2) infection   . Joint pain   . Knee problem   . Migraine 10/19/2014  . Obesity   . Shortness of breath   . Sleep apnea   . Vitamin D deficiency   . Vitamin deficiency    HX OF LOW VIT D     Patient Active Problem List   Diagnosis Date Noted  . Hiatal hernia 03/31/2017  . Sleep disturbance 03/31/2017  . Abnormal mammogram of left breast 12/01/2014  . Migraine 10/19/2014  . Appetite disorder 10/13/2013  . Extreme obesity 10/13/2013  . Back spasm 04/14/2013  . Fibroid   . Vitamin D deficiency   . HSV-2 (herpes simplex virus 2) infection     Past Surgical History:  Procedure Laterality Date  . TUMOR REMOVAL     fibroid  . VAGINAL DELIVERY     x 1    OB History    Gravida Para Term Preterm AB Living   4 2 1 1 2 1    SAB TAB Ectopic Multiple Live Births   2       1       Home Medications     Prior to Admission medications   Medication Sig Start Date End Date Taking? Authorizing Provider  SUMAtriptan (IMITREX) 100 MG tablet Take 1 tablet (100 mg total) by mouth 2 (two) times daily as needed for migraine. 01/31/15   Kathrynn Ducking, MD    Family History Family History  Problem Relation Age of Onset  . Breast cancer Mother 58  . Thyroid disease Mother   . Obesity Mother   . Breast cancer Maternal Grandmother 39  . Colon cancer Maternal Grandmother   . Diabetes Father   . Heart disease Father   . Hyperlipidemia Father   . Hypertension Father   . Sleep apnea Father   . Alcoholism Father   . Obesity Father   . Breast cancer Maternal Aunt   . Breast cancer Maternal Aunt   . Migraines Son   . Migraines Brother     Social History Social History   Tobacco Use  . Smoking status: Never Smoker  . Smokeless tobacco: Never Used  Substance Use Topics  . Alcohol use: No  . Drug use: No     Allergies  Eye drops [naphazoline-polyethyl glycol]   Review of Systems Review of Systems  All other systems reviewed and are negative.    Physical Exam Updated Vital Signs BP 133/78 (BP Location: Right Arm)   Pulse 84   Temp 97.9 F (36.6 C) (Oral)   Resp 20   Ht 5\' 10"  (1.778 m)   Wt (!) 152.4 kg (336 lb)   LMP 10/08/2017 (Approximate)   SpO2 99%   BMI 48.21 kg/m   Physical Exam  Constitutional: She is oriented to person, place, and time. She appears well-developed and well-nourished.  HENT:  Head: Normocephalic and atraumatic.  Normal oropharynx  Eyes: Conjunctivae and EOM are normal. Pupils are equal, round, and reactive to light.  Neck: Normal range of motion. Neck supple.  Cardiovascular: Normal rate, regular rhythm and normal heart sounds. Exam reveals no gallop and no friction rub.  No murmur heard. Pulmonary/Chest: Effort normal and breath sounds normal. No stridor. No respiratory distress. She has no wheezes. She has no rales. She exhibits no  tenderness.  CTAB  Abdominal: Soft. Bowel sounds are normal. She exhibits no distension and no mass. There is no tenderness. There is no rebound and no guarding.  Musculoskeletal: Normal range of motion. She exhibits no edema or tenderness.  Neurological: She is alert and oriented to person, place, and time.  Skin: Skin is warm and dry.  4x4 cm circular lesion on left upper arm, a few small dime sized bites on the left low back, and a quarter sized bite on right anterior foot, no discharge, no duration or fluctuance  Psychiatric: She has a normal mood and affect. Her behavior is normal. Judgment and thought content normal.  Nursing note and vitals reviewed.    ED Treatments / Results  Labs (all labs ordered are listed, but only abnormal results are displayed) Labs Reviewed - No data to display  EKG  EKG Interpretation None       Radiology No results found.  Procedures Procedures (including critical care time)  Medications Ordered in ED Medications  dexamethasone (DECADRON) injection 10 mg (not administered)  famotidine (PEPCID) tablet 20 mg (not administered)  doxycycline (VIBRA-TABS) tablet 100 mg (not administered)     Initial Impression / Assessment and Plan / ED Course  I have reviewed the triage vital signs and the nursing notes.  Pertinent labs & imaging results that were available during my care of the patient were reviewed by me and considered in my medical decision making (see chart for details).     Patient with insect bites and allergic reaction.  Will treat symptoms.  Will cover for infection.  VSS.  No evidence of anaphylaxis.  Stable for discharge and outpatient follow-up.  Final Clinical Impressions(s) / ED Diagnoses   Final diagnoses:  Insect bite, initial encounter    ED Discharge Orders        Ordered    famotidine (PEPCID) 20 MG tablet  2 times daily     10/11/17 2318    doxycycline (VIBRAMYCIN) 100 MG capsule  2 times daily     10/11/17  2318       Montine Circle, PA-C 10/11/17 2319    Molpus, Jenny Reichmann, MD 10/12/17 [phone]

## 2017-10-11 NOTE — ED Triage Notes (Signed)
Pt presents with multiple reddened areas to back, foot and left medial elbow.  ? Insect bites.  Pt took benadryl 25 approx 0800 without relief.

## 2018-03-04 ENCOUNTER — Encounter: Payer: Self-pay | Admitting: Physician Assistant

## 2018-05-04 IMAGING — CR DG CHEST 2V
2 series · 2 of 2 positions shown · non-contrast
Comparison: None.

CLINICAL DATA: Shortness of breath and cough for 1 day.

EXAM:
CHEST  2 VIEW

[w chest pa]
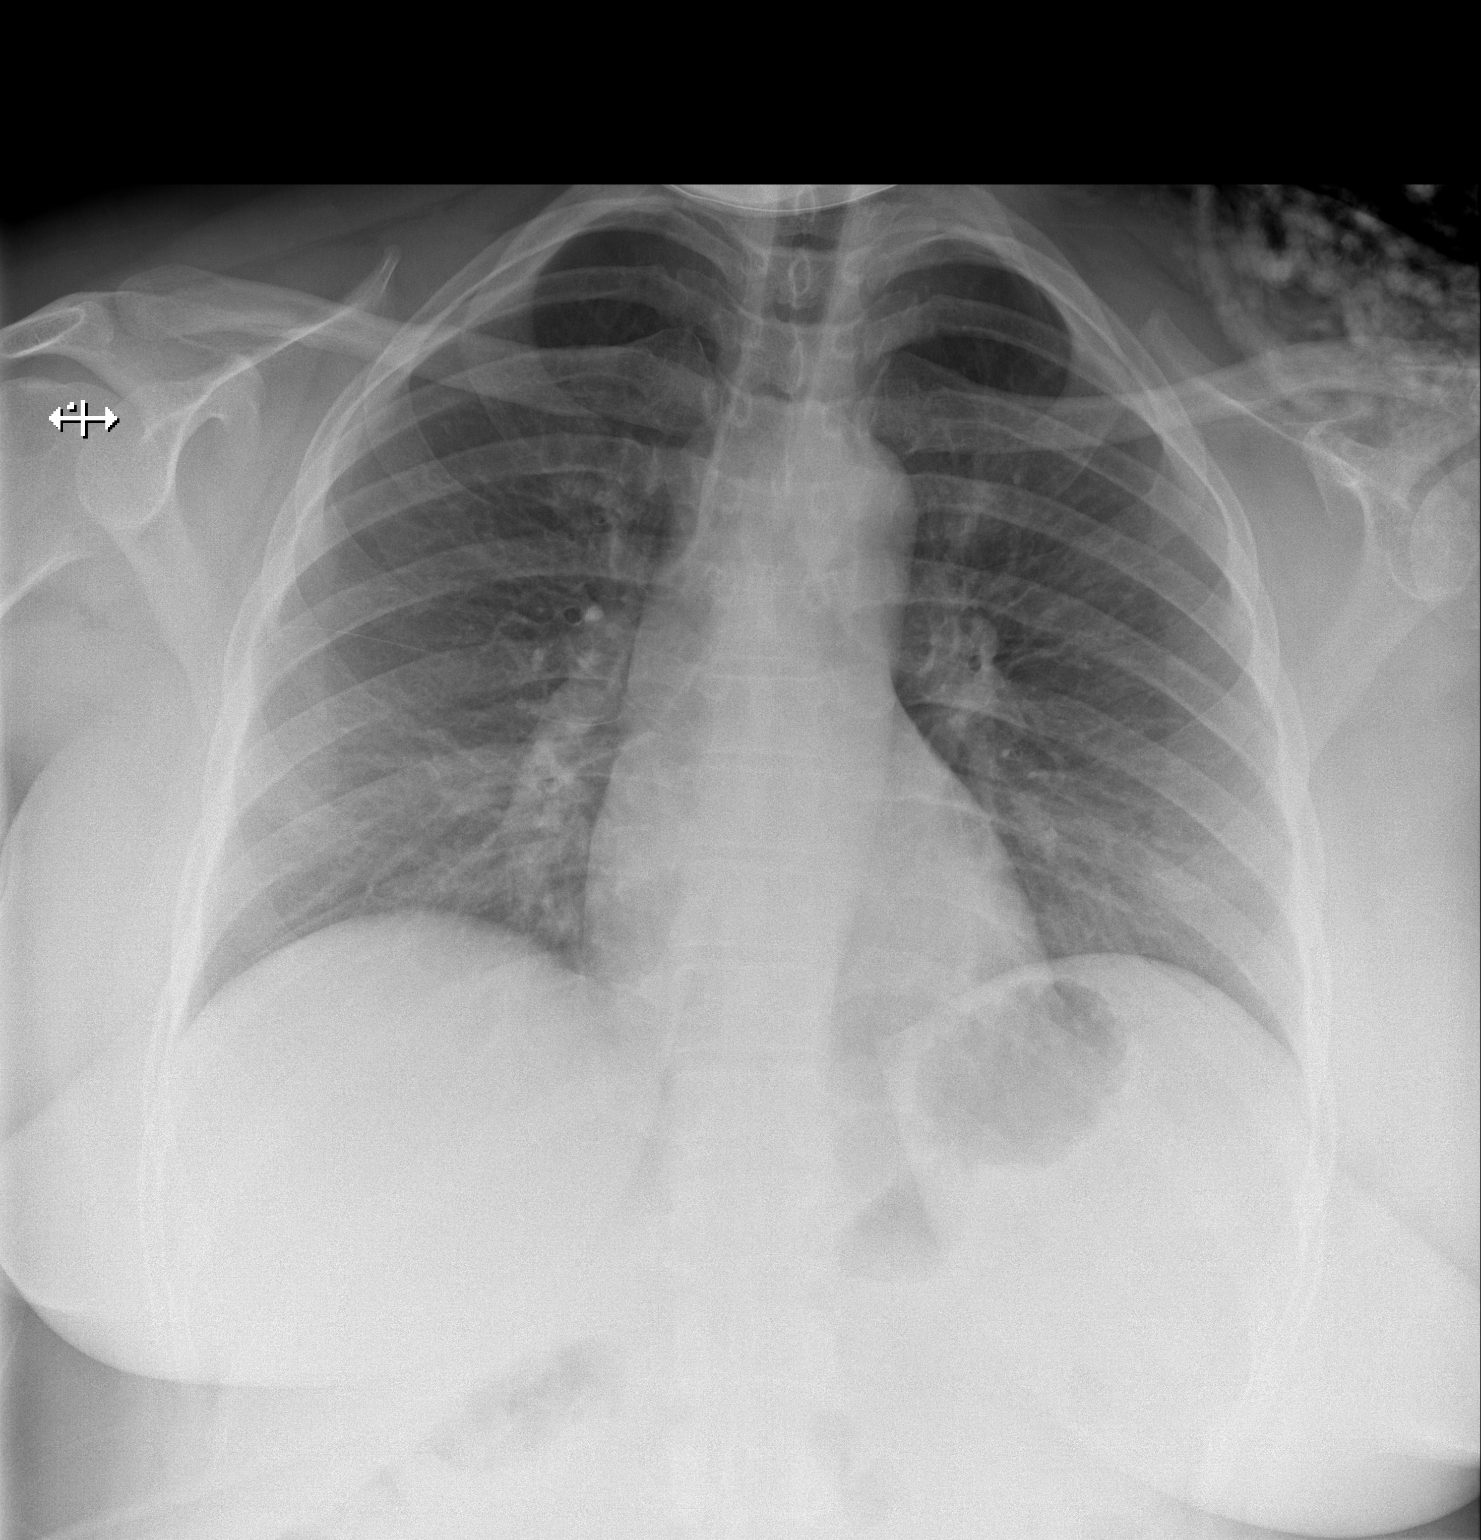

[w chest lat]
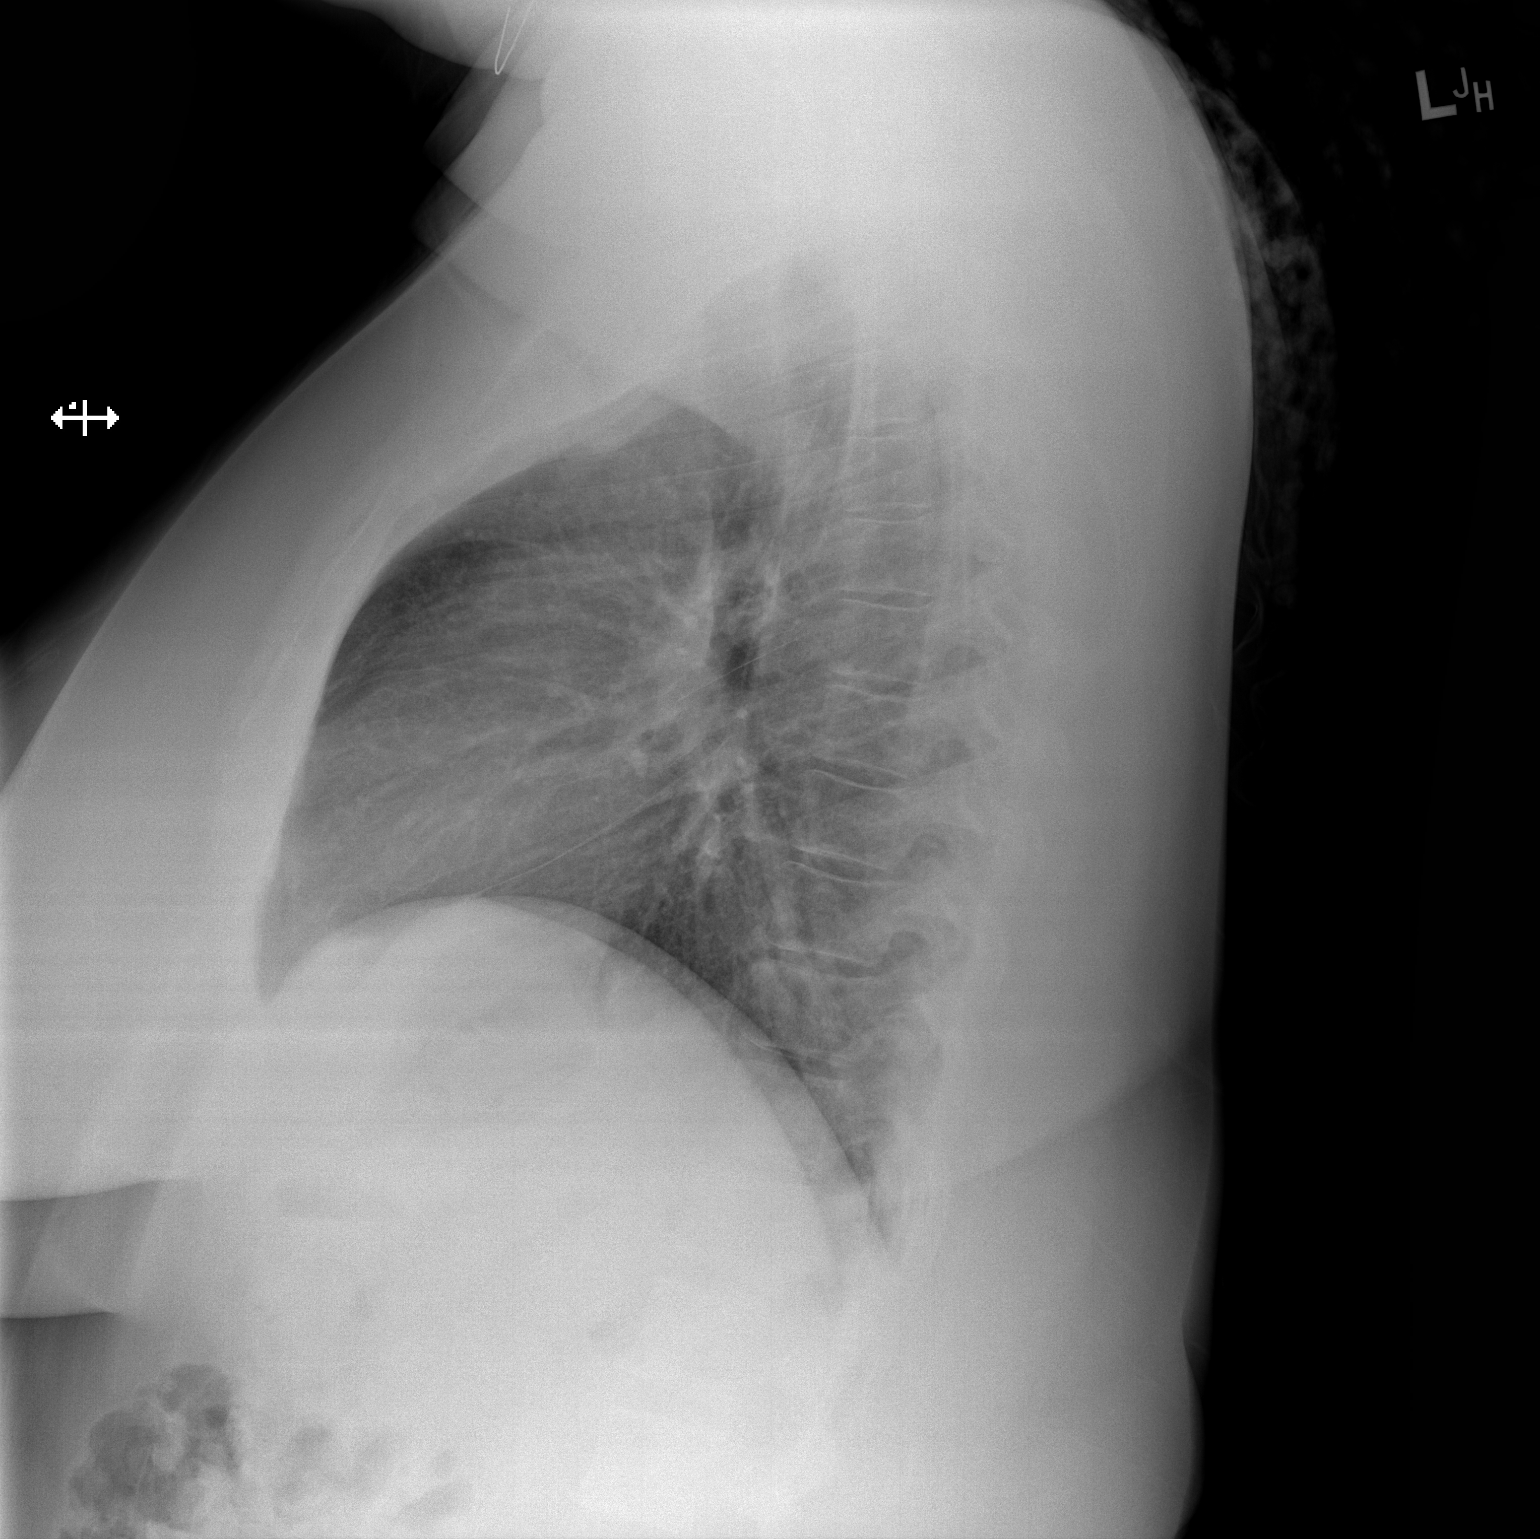

[2 of 2 positions shown; findings below may reference images not displayed]

FINDINGS: The cardiomediastinal silhouette is unremarkable.

There is no evidence of focal airspace disease, pulmonary edema,
suspicious pulmonary nodule/mass, pleural effusion, or pneumothorax.
No acute bony abnormalities are identified.
IMPRESSION: No active cardiopulmonary disease.

## 2018-09-25 DIAGNOSIS — J0100 Acute maxillary sinusitis, unspecified: Secondary | ICD-10-CM | POA: Diagnosis not present

## 2018-10-14 ENCOUNTER — Ambulatory Visit: Payer: 59 | Admitting: Family Medicine

## 2018-10-14 ENCOUNTER — Other Ambulatory Visit: Payer: Self-pay

## 2018-10-14 ENCOUNTER — Encounter: Payer: Self-pay | Admitting: Family Medicine

## 2018-10-14 VITALS — BP 118/87 | HR 80 | Temp 98.2°F | Ht 70.5 in | Wt 343.8 lb

## 2018-10-14 DIAGNOSIS — N910 Primary amenorrhea: Secondary | ICD-10-CM | POA: Diagnosis not present

## 2018-10-14 DIAGNOSIS — M25512 Pain in left shoulder: Secondary | ICD-10-CM

## 2018-10-14 DIAGNOSIS — M545 Low back pain, unspecified: Secondary | ICD-10-CM

## 2018-10-14 DIAGNOSIS — S39012A Strain of muscle, fascia and tendon of lower back, initial encounter: Secondary | ICD-10-CM | POA: Diagnosis not present

## 2018-10-14 LAB — POCT URINE PREGNANCY: Preg Test, Ur: NEGATIVE

## 2018-10-14 MED ORDER — CYCLOBENZAPRINE HCL 5 MG PO TABS: ORAL_TABLET | ORAL | 0 refills | Status: DC

## 2018-10-14 MED ORDER — MELOXICAM 7.5 MG PO TABS: 7.5000 mg | ORAL_TABLET | Freq: Every day | ORAL | 0 refills | Status: DC

## 2018-10-14 NOTE — Progress Notes (Signed)
Subjective:  By signing my name below, I, Moises Blood, attest that this documentation has been prepared under the direction and in the presence of Merri Ray, MD. Electronically Signed: Moises Blood, [place]. 10/14/2018 , 9:56 AM .  Patient was seen in Room 9 .   Patient ID: Georgiann Mohs, female    DOB: Feb 27, 1976, 42 y.o.   MRN: [id number] Chief Complaint  Patient presents with  . Back Pain    lower back pain since monday Morning also having some left shoulder pain   . hcg check   HPI Shayda Lute is a 42 y.o. female   Low back pain Patient states she noticed some intermittent low back pain starting about 3 days ago after bending over when getting some clothes out of her laundry basket. She reports pain when standing or putting some pressure. She denies any back spasms, urinary/bowel incontinence, or pain radiating down her legs. She took 2 Advils twice on Monday (3 days ago), and then 2 Tylenol on Tuesday (2 days ago) but without relief. She has taken muscle relaxants in the past.   Left shoulder pain She mentions left shoulder pain ongoing for a few months now. She denies any known injury. She is left hand dominant. 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   Pregnancy test She's been having unprotected intercourse with her married. LMP in Sept, periods usually once a month. She isn't necessarily trying to become pregnant, but if it happens, it happens. She's not on contraceptions. She denies testing with home pregnancy test.   Patient Active Problem List   Diagnosis Date Noted  . Hiatal hernia 03/31/2017  . Sleep disturbance 03/31/2017  . Abnormal mammogram of left breast 12/01/2014  . Migraine 10/19/2014  . Appetite disorder 10/13/2013  . Extreme obesity 10/13/2013  . Back spasm 04/14/2013  . Fibroid   . Vitamin D deficiency   . HSV-2 (herpes simplex virus 2) infection    Past Medical History:  Diagnosis Date  . Allergy   . Anemia   . Back spasm     . Depression   . Fibroid   . HSV-2 (herpes simplex virus 2) infection   . Joint pain   . Knee problem   . Migraine 10/19/2014  . Obesity   . Shortness of breath   . Sleep apnea   . Vitamin D deficiency   . Vitamin deficiency    HX OF LOW VIT D    Past Surgical History:  Procedure Laterality Date  . TUMOR REMOVAL     fibroid  . VAGINAL DELIVERY     x 1   Allergies  Allergen Reactions  . Eye Drops [Naphazoline-Polyethyl Glycol] Swelling   Prior to Admission medications   Medication Sig Start Date End Date Taking? Authorizing Provider  doxycycline (VIBRAMYCIN) 100 MG capsule Take 1 capsule (100 mg total) 2 (two) times daily by mouth. 10/11/17   Montine Circle, PA-C  famotidine (PEPCID) 20 MG tablet Take 1 tablet (20 mg total) 2 (two) times daily by mouth. 10/11/17   Montine Circle, PA-C  SUMAtriptan (IMITREX) 100 MG tablet Take 1 tablet (100 mg total) by mouth 2 (two) times daily as needed for migraine. 01/31/15   Kathrynn Ducking, MD   Social History   Socioeconomic History  . Marital status: Married    Spouse name: engaged  . Number of children: 1  . Years of education: college  . Highest education level: Not on file  Occupational History  .  Occupation: Research scientist (physical sciences): [employer]  Social Needs  . Financial resource strain: Not on file  . Food insecurity:    Worry: Not on file    Inability: Not on file  . Transportation needs:    Medical: Not on file    Non-medical: Not on file  Tobacco Use  . Smoking status: Never Smoker  . Smokeless tobacco: Never Used  Substance and Sexual Activity  . Alcohol use: No  . Drug use: No  . Sexual activity: Yes    Partners: Male    Birth control/protection: None  Lifestyle  . Physical activity:    Days per week: Not on file    Minutes per session: Not on file  . Stress: Not on file  Relationships  . Social connections:    Talks on phone: Not on file    Gets together: Not on file    Attends  religious service: Not on file    Active member of club or organization: Not on file    Attends meetings of clubs or organizations: Not on file    Relationship status: Not on file  . Intimate partner violence:    Fear of current or ex partner: Not on file    Emotionally abused: Not on file    Physically abused: Not on file    Forced sexual activity: Not on file  Other Topics Concern  . Not on file  Social History Narrative   Lives with her son and her fiance.   Patient is left handed.   Patient drinks caffeine occasionally.   Review of Systems  Constitutional: Negative for chills, fatigue, fever and unexpected weight change.  Respiratory: Negative for cough.   Gastrointestinal: Negative for constipation, diarrhea, nausea and vomiting.  Genitourinary: Negative for menstrual problem.  Musculoskeletal: Positive for arthralgias (left shoulder) and back pain.  Skin: Negative for rash and wound.  Neurological: Negative for dizziness, weakness and headaches.       Objective:   Physical Exam  Constitutional: She is oriented to person, place, and time. She appears well-developed and well-nourished. No distress.  HENT:  Head: Normocephalic and atraumatic.  Eyes: Pupils are equal, round, and reactive to light. EOM are normal.  Neck: Neck supple.  Cardiovascular: Normal rate.  Pulmonary/Chest: Effort normal. No respiratory distress.  Musculoskeletal: Normal range of motion.  Lumbar spine: no midline bony tenderness, describes area of pain in right paraspinals, discomfort with standing in right paraspinals, somewhat guarded with heel-toe walk, but able to stand without assistance; guarded with flexion to approximately 40-50 degrees, left lateral and right lateral flexion intact Left shoulder: positive neer, negative hawkin's, pain with empty can testing, full passive ROM, some discomfort with active ROM abduction Lower extremities: strength in tact, and equal strength, negative seated  straight leg raise, pain free internal and external rotation of the hip  Neurological: She is alert and oriented to person, place, and time.  Reflex Scores:      Patellar reflexes are 2+ on the right side and 2+ on the left side.      Achilles reflexes are 2+ on the right side and 2+ on the left side. Skin: Skin is warm and dry.  Psychiatric: She has a normal mood and affect. Her behavior is normal.  Nursing note and vitals reviewed.   Vitals:   10/14/18 0906 10/14/18 0919  BP: (!) 142/91 118/87  Pulse: 80   Temp: 98.2 F (36.8 C)   TempSrc: Oral  SpO2: 99%   Weight: (!) 343 lb 12.8 oz (155.9 kg)   Height: 5' 10.5" (1.791 m)    Results for orders placed or performed in visit on 10/14/18  POCT urine pregnancy  Result Value Ref Range   Preg Test, Ur Negative Negative       Assessment & Plan:    Desirae Pedley is a 42 y.o. female Low back strain, initial encounter - Plan: meloxicam (MOBIC) 7.5 MG tablet, cyclobenzaprine (FLEXERIL) 5 MG tablet Acute low back pain without sciatica, unspecified back pain laterality - Plan: meloxicam (MOBIC) 7.5 MG tablet, cyclobenzaprine (FLEXERIL) 5 MG tablet  -Suspected lumbar strain, no red flags on history or exam at present.  Decided to defer imaging at this time.  Trial of meloxicam daily as needed, Flexeril if needed, potential side effects and risk discussed, RTC precautions if pain not improving in the next week to 10 days, sooner if worse  Delayed menses - Plan: POCT urine pregnancy  -hCG negative, early in pregnancy discussed but unlikely.  Can follow-up with primary care provider discussed abnormal menses further  Acute pain of left shoulder  -No known injury.  May have component of rotator cuff tendinitis and  bursitis.  Meloxicam may help as above, but advised to follow-up next few weeks if not improving for possible imaging plus or minus injection versus Ortho eval.   Meds ordered this encounter  Medications  . meloxicam  (MOBIC) 7.5 MG tablet    Sig: Take 1 tablet (7.5 mg total) by mouth daily.    Dispense:  30 tablet    Refill:  0  . cyclobenzaprine (FLEXERIL) 5 MG tablet    Sig: 1 pill by mouth up to every 8 hours as needed. Start with one pill by mouth each bedtime as needed due to sedation    Dispense:  15 tablet    Refill:  0   Patient Instructions   I suspect you have a low back strain.  Try meloxicam once per day, do not take other over-the-counter anti-inflammatories but Tylenol is okay.  Gentle range of motion as tolerated, heat or ice to area as needed and follow-up if not improving in the next week to 10 days.  Return to the clinic or go to the nearest emergency room if any of your symptoms worsen or new symptoms occur.  Shoulder pain may be a component of bursitis or tendinitis of the rotator cuff.  Anti-inflammatories should help, but please follow-up in the next 3 to 4 weeks to discuss that further.  Sooner if worse.  Thank you for coming in today.     Back Pain, Adult Many adults have back pain from time to time. Common causes of back pain include:  A strained muscle or ligament.  Wear and tear (degeneration) of the spinal disks.  Arthritis.  A hit to the back.  Back pain can be short-lived (acute) or last a long time (chronic). A physical exam, lab tests, and imaging studies may be done to find the cause of your pain. Follow these instructions at home: Managing pain and stiffness  Take over-the-counter and prescription medicines only as told by your health care provider.  If directed, apply heat to the affected area as often as told by your health care provider. Use the heat source that your health care provider recommends, such as a moist heat pack or a heating pad. ? Place a towel between your skin and the heat source. ? Leave the heat on for  20-30 minutes. ? Remove the heat if your skin turns bright red. This is especially important if you are unable to feel pain, heat, or  cold. You have a greater risk of getting burned.  If directed, apply ice to the injured area: ? Put ice in a plastic bag. ? Place a towel between your skin and the bag. ? Leave the ice on for 20 minutes, 2-3 times a day for the first 2-3 days. Activity  Do not stay in bed. Resting more than 1-2 days can delay your recovery.  Take short walks on even surfaces as soon as you are able. Try to increase the length of time you walk each day.  Do not sit, drive, or stand in one place for more than 30 minutes at a time. Sitting or standing for long periods of time can put stress on your back.  Use proper lifting techniques. When you bend and lift, use positions that put less stress on your back: ? [place] your knees. ? Keep the load close to your body. ? Avoid twisting.  Exercise regularly as told by your health care provider. Exercising will help your back heal faster. This also helps prevent back injuries by keeping muscles strong and flexible.  Your health care provider may recommend that you see a physical therapist. This person can help you come up with a safe exercise program. Do any exercises as told by your physical therapist. Lifestyle  Maintain a healthy weight. Extra weight puts stress on your back and makes it difficult to have good posture.  Avoid activities or situations that make you feel anxious or stressed. Learn ways to manage anxiety and stress. One way to manage stress is through exercise. Stress and anxiety increase muscle tension and can make back pain worse. General instructions  Sleep on a firm mattress in a comfortable position. Try lying on your side with your knees slightly bent. If you lie on your back, put a pillow under your knees.  Follow your treatment plan as told by your health care provider. This may include: ? Cognitive or behavioral therapy. ? Acupuncture or massage therapy. ? Meditation or yoga. Contact a health care provider if:  You have pain that is  not relieved with rest or medicine.  You have increasing pain going down into your legs or buttocks.  Your pain does not improve in 2 weeks.  You have pain at night.  You lose weight.  You have a fever or chills. Get help right away if:  You develop new bowel or bladder control problems.  You have unusual weakness or numbness in your arms or legs.  You develop nausea or vomiting.  You develop abdominal pain.  You feel faint. Summary  Many adults have back pain from time to time. A physical exam, lab tests, and imaging studies may be done to find the cause of your pain.  Use proper lifting techniques. When you bend and lift, use positions that put less stress on your back.  Take over-the-counter and prescription medicines and apply heat or ice as directed by your health care provider. This information is not intended to replace advice given to you by your health care provider. Make sure you discuss any questions you have with your health care provider. Document Released: 11/17/2005 Document Revised: 12/22/2016 Document Reviewed: 12/22/2016 Elsevier Interactive Patient Education  2018 [place] Strain A strain is a stretch or tear in a muscle or the strong cords of  tissue that attach muscle to bone (tendons). Strains of the lower back (lumbar spine) are a common cause of low back pain. A strain occurs when muscles or tendons are torn or are stretched beyond their limits. The muscles may become inflamed, resulting in pain and sudden muscle tightening (spasms). A strain can happen suddenly due to an injury (trauma), or it can develop gradually due to overuse. There are three types of strains:  Grade 1 is a mild strain involving a minor tear of the muscle fibers or tendons. This may cause some pain but no loss of muscle strength.  Grade 2 is a moderate strain involving a partial tear of the muscle fibers or tendons. This causes more severe pain and some loss of  muscle strength.  Grade 3 is a severe strain involving a complete tear of the muscle or tendon. This causes severe pain and complete or nearly complete loss of muscle strength.  What are the causes? This condition may be caused by:  Trauma, such as a fall or a hit to the body.  Twisting or overstretching the back. This may result from doing activities that require a lot of energy, such as lifting heavy objects.  What increases the risk? The following factors may increase your risk of getting this condition:  Playing contact sports.  Participating in sports or activities that put excessive stress on the back and require a lot of bending and twisting, including: ? Lifting weights or heavy objects. ? Gymnastics. ? Soccer. ? Figure skating. ? Snowboarding.  Being overweight or obese.  Having poor strength and flexibility.  What are the signs or symptoms? Symptoms of this condition may include:  Sharp or dull pain in the lower back that does not go away. Pain may extend to the buttocks.  Stiffness.  Limited range of motion.  Inability to stand up straight due to stiffness or pain.  Muscle spasms.  How is this diagnosed? This condition may be diagnosed based on:  Your symptoms.  Your medical history.  A physical exam. ? Your health care provider may push on certain areas of your back to determine the source of your pain. ? You may be asked to bend forward, backward, and side to side to assess the severity of your pain and your range of motion.  Imaging tests, such as: ? X-rays. ? MRI.  How is this treated? Treatment for this condition may include:  Applying heat and cold to the affected area.  Medicines to help relieve pain and to relax your muscles (muscle relaxants).  NSAIDs to help reduce swelling and discomfort.  Physical therapy.  When your symptoms improve, it is important to gradually return to your normal routine as soon as possible to reduce pain,  avoid stiffness, and avoid loss of muscle strength. Generally, symptoms should improve within 6 weeks of treatment. However, recovery time varies. Follow these instructions at home: Managing pain, stiffness, and swelling  If directed, apply ice to the injured area during the first 24 hours after your injury. ? Put ice in a plastic bag. ? Place a towel between your skin and the bag. ? Leave the ice on for 20 minutes, 2-3 times a day.  If directed, apply heat to the affected area as often as told by your health care provider. Use the heat source that your health care provider recommends, such as a moist heat pack or a heating pad. ? Place a towel between your skin and the heat source. ? Leave  the heat on for 20-30 minutes. ? Remove the heat if your skin turns bright red. This is especially important if you are unable to feel pain, heat, or cold. You may have a greater risk of getting burned. Activity  Rest and return to your normal activities as told by your health care provider. Ask your health care provider what activities are safe for you.  Avoid activities that take a lot of effort (are strenuous) for as long as told by your health care provider.  Do exercises as told by your health care provider. General instructions   Take over-the-counter and prescription medicines only as told by your health care provider.  If you have questions or concerns about safety while taking pain medicine, talk with your health care provider.  Do not drive or operate heavy machinery until you know how your pain medicine affects you.  Do not use any tobacco products, such as cigarettes, chewing tobacco, and e-cigarettes. Tobacco can delay bone healing. If you need help quitting, ask your health care provider.  Keep all follow-up visits as told by your health care provider. This is important. How is this prevented?  Warm up and stretch before being active.  Cool down and stretch after being  active.  Give your body time to rest between periods of activity.  Avoid: ? Being physically inactive for long periods at a time. ? Exercising or playing sports when you are tired or in pain.  Use correct form when playing sports and lifting heavy objects.  Use good posture when sitting and standing.  Maintain a healthy weight.  Sleep on a mattress with medium firmness to support your back.  Make sure to use equipment that fits you, including shoes that fit well.  Be safe and responsible while being active to avoid falls.  Do at least 150 minutes of moderate-intensity exercise each week, such as brisk walking or water aerobics. Try a form of exercise that takes stress off your back, such as swimming or stationary cycling.  Maintain physical fitness, including: ? Strength. ? Flexibility. ? Cardiovascular fitness. ? Endurance. Contact a health care provider if:  Your back pain does not improve after 6 weeks of treatment.  Your symptoms get worse. Get help right away if:  Your back pain is severe.  You are unable to stand or walk.  You develop pain in your legs.  You develop weakness in your buttocks or legs.  You have difficulty controlling when you urinate or when you have a bowel movement. This information is not intended to replace advice given to you by your health care provider. Make sure you discuss any questions you have with your health care provider. Document Released: 11/17/2005 Document Revised: 07/24/2016 Document Reviewed: 08/29/2015 Elsevier Interactive Patient Education  2017 [employer]  Shoulder Pain Many things can cause shoulder pain, including:  An injury to the area.  Overuse of the shoulder.  Arthritis.  The source of the pain can be:  Inflammation.  An injury to the shoulder joint.  An injury to a tendon, ligament, or bone.  Follow these instructions at home: Take these actions to help with your pain:  Squeeze a soft ball or a  foam pad as much as possible. This helps to keep the shoulder from swelling. It also helps to strengthen the arm.  Take over-the-counter and prescription medicines only as told by your health care provider.  If directed, apply ice to the area: ? Put ice in a plastic bag. ?  Place a towel between your skin and the bag. ? Leave the ice on for 20 minutes, 2-3 times per day. Stop applying ice if it does not help with the pain.  If you were given a shoulder sling or immobilizer: ? Wear it as told. ? Remove it to shower or bathe. ? Move your arm as little as possible, but keep your hand moving to prevent swelling.  Contact a health care provider if:  Your pain gets worse.  Your pain is not relieved with medicines.  New pain develops in your arm, hand, or fingers. Get help right away if:  Your arm, hand, or fingers: ? Tingle. ? Become numb. ? Become swollen. ? Become painful. ? Turn white or blue. This information is not intended to replace advice given to you by your health care provider. Make sure you discuss any questions you have with your health care provider. Document Released: 08/27/2005 Document Revised: 07/13/2016 Document Reviewed: 03/12/2015 Elsevier Interactive Patient Education  [employer].    If you have lab work done today you will be contacted with your lab results within the next 2 weeks.  If you have not heard from [place] then please contact us. The fastest way to get your results is to register for My Chart.   IF you received an x-ray today, you will receive an invoice from [facility]. Please contact [facility] at [phone] with questions or concerns regarding your invoice.   IF you received labwork today, you will receive an invoice from [facility]. Please contact [facility] at [phone] with questions or concerns regarding your invoice.   Our billing staff will not be able to assist you with questions regarding bills from these  companies.  You will be contacted with the lab results as soon as they are available. The fastest way to get your results is to activate your My Chart account. Instructions are located on the last page of this paperwork. If you have not heard from [place] regarding the results in 2 weeks, please contact this office.       I personally performed the services described in this documentation, which was scribed in my presence. The recorded information has been reviewed and considered for accuracy and completeness, addended by me as needed, and agree with information above.  Signed,   Merri Ray, MD [facility].  10/14/18 1:29 PM

## 2018-10-14 NOTE — Patient Instructions (Addendum)
I suspect you have a low back strain.  Try meloxicam once per day, do not take other over-the-counter anti-inflammatories but Tylenol is okay.  Gentle range of motion as tolerated, heat or ice to area as needed and follow-up if not improving in the next week to 10 days.  Return to the clinic or go to the nearest emergency room if any of your symptoms worsen or new symptoms occur.  Shoulder pain may be a component of bursitis or tendinitis of the rotator cuff.  Anti-inflammatories should help, but please follow-up in the next 3 to 4 weeks to discuss that further.  Sooner if worse.  Thank you for coming in today.     Back Pain, Adult Many adults have back pain from time to time. Common causes of back pain include:  A strained muscle or ligament.  Wear and tear (degeneration) of the spinal disks.  Arthritis.  A hit to the back.  Back pain can be short-lived (acute) or last a long time (chronic). A physical exam, lab tests, and imaging studies may be done to find the cause of your pain. Follow these instructions at home: Managing pain and stiffness  Take over-the-counter and prescription medicines only as told by your health care provider.  If directed, apply heat to the affected area as often as told by your health care provider. Use the heat source that your health care provider recommends, such as a moist heat pack or a heating pad. ? Place a towel between your skin and the heat source. ? Leave the heat on for 20-30 minutes. ? Remove the heat if your skin turns bright red. This is especially important if you are unable to feel pain, heat, or cold. You have a greater risk of getting burned.  If directed, apply ice to the injured area: ? Put ice in a plastic bag. ? Place a towel between your skin and the bag. ? Leave the ice on for 20 minutes, 2-3 times a day for the first 2-3 days. Activity  Do not stay in bed. Resting more than 1-2 days can delay your recovery.  Take short walks  on even surfaces as soon as you are able. Try to increase the length of time you walk each day.  Do not sit, drive, or stand in one place for more than 30 minutes at a time. Sitting or standing for long periods of time can put stress on your back.  Use proper lifting techniques. When you bend and lift, use positions that put less stress on your back: ? Lane your knees. ? Keep the load close to your body. ? Avoid twisting.  Exercise regularly as told by your health care provider. Exercising will help your back heal faster. This also helps prevent back injuries by keeping muscles strong and flexible.  Your health care provider may recommend that you see a physical therapist. This person can help you come up with a safe exercise program. Do any exercises as told by your physical therapist. Lifestyle  Maintain a healthy weight. Extra weight puts stress on your back and makes it difficult to have good posture.  Avoid activities or situations that make you feel anxious or stressed. Learn ways to manage anxiety and stress. One way to manage stress is through exercise. Stress and anxiety increase muscle tension and can make back pain worse. General instructions  Sleep on a firm mattress in a comfortable position. Try lying on your side with your knees slightly bent. If  you lie on your back, put a pillow under your knees.  Follow your treatment plan as told by your health care provider. This may include: ? Cognitive or behavioral therapy. ? Acupuncture or massage therapy. ? Meditation or yoga. Contact a health care provider if:  You have pain that is not relieved with rest or medicine.  You have increasing pain going down into your legs or buttocks.  Your pain does not improve in 2 weeks.  You have pain at night.  You lose weight.  You have a fever or chills. Get help right away if:  You develop new bowel or bladder control problems.  You have unusual weakness or numbness in your  arms or legs.  You develop nausea or vomiting.  You develop abdominal pain.  You feel faint. Summary  Many adults have back pain from time to time. A physical exam, lab tests, and imaging studies may be done to find the cause of your pain.  Use proper lifting techniques. When you bend and lift, use positions that put less stress on your back.  Take over-the-counter and prescription medicines and apply heat or ice as directed by your health care provider. This information is not intended to replace advice given to you by your health care provider. Make sure you discuss any questions you have with your health care provider. Document Released: 11/17/2005 Document Revised: 12/22/2016 Document Reviewed: 12/22/2016 Elsevier Interactive Patient Education  2018 [place] Strain A strain is a stretch or tear in a muscle or the strong cords of tissue that attach muscle to bone (tendons). Strains of the lower back (lumbar spine) are a common cause of low back pain. A strain occurs when muscles or tendons are torn or are stretched beyond their limits. The muscles may become inflamed, resulting in pain and sudden muscle tightening (spasms). A strain can happen suddenly due to an injury (trauma), or it can develop gradually due to overuse. There are three types of strains:  Grade 1 is a mild strain involving a minor tear of the muscle fibers or tendons. This may cause some pain but no loss of muscle strength.  Grade 2 is a moderate strain involving a partial tear of the muscle fibers or tendons. This causes more severe pain and some loss of muscle strength.  Grade 3 is a severe strain involving a complete tear of the muscle or tendon. This causes severe pain and complete or nearly complete loss of muscle strength.  What are the causes? This condition may be caused by:  Trauma, such as a fall or a hit to the body.  Twisting or overstretching the back. This may result from doing  activities that require a lot of energy, such as lifting heavy objects.  What increases the risk? The following factors may increase your risk of getting this condition:  Playing contact sports.  Participating in sports or activities that put excessive stress on the back and require a lot of bending and twisting, including: ? Lifting weights or heavy objects. ? Gymnastics. ? Soccer. ? Figure skating. ? Snowboarding.  Being overweight or obese.  Having poor strength and flexibility.  What are the signs or symptoms? Symptoms of this condition may include:  Sharp or dull pain in the lower back that does not go away. Pain may extend to the buttocks.  Stiffness.  Limited range of motion.  Inability to stand up straight due to stiffness or pain.  Muscle spasms.  How  is this diagnosed? This condition may be diagnosed based on:  Your symptoms.  Your medical history.  A physical exam. ? Your health care provider may push on certain areas of your back to determine the source of your pain. ? You may be asked to bend forward, backward, and side to side to assess the severity of your pain and your range of motion.  Imaging tests, such as: ? X-rays. ? MRI.  How is this treated? Treatment for this condition may include:  Applying heat and cold to the affected area.  Medicines to help relieve pain and to relax your muscles (muscle relaxants).  NSAIDs to help reduce swelling and discomfort.  Physical therapy.  When your symptoms improve, it is important to gradually return to your normal routine as soon as possible to reduce pain, avoid stiffness, and avoid loss of muscle strength. Generally, symptoms should improve within 6 weeks of treatment. However, recovery time varies. Follow these instructions at home: Managing pain, stiffness, and swelling  If directed, apply ice to the injured area during the first 24 hours after your injury. ? Put ice in a plastic bag. ? Place  a towel between your skin and the bag. ? Leave the ice on for 20 minutes, 2-3 times a day.  If directed, apply heat to the affected area as often as told by your health care provider. Use the heat source that your health care provider recommends, such as a moist heat pack or a heating pad. ? Place a towel between your skin and the heat source. ? Leave the heat on for 20-30 minutes. ? Remove the heat if your skin turns bright red. This is especially important if you are unable to feel pain, heat, or cold. You may have a greater risk of getting burned. Activity  Rest and return to your normal activities as told by your health care provider. Ask your health care provider what activities are safe for you.  Avoid activities that take a lot of effort (are strenuous) for as long as told by your health care provider.  Do exercises as told by your health care provider. General instructions   Take over-the-counter and prescription medicines only as told by your health care provider.  If you have questions or concerns about safety while taking pain medicine, talk with your health care provider.  Do not drive or operate heavy machinery until you know how your pain medicine affects you.  Do not use any tobacco products, such as cigarettes, chewing tobacco, and e-cigarettes. Tobacco can delay bone healing. If you need help quitting, ask your health care provider.  Keep all follow-up visits as told by your health care provider. This is important. How is this prevented?  Warm up and stretch before being active.  Cool down and stretch after being active.  Give your body time to rest between periods of activity.  Avoid: ? Being physically inactive for long periods at a time. ? Exercising or playing sports when you are tired or in pain.  Use correct form when playing sports and lifting heavy objects.  Use good posture when sitting and standing.  Maintain a healthy weight.  Sleep on a  mattress with medium firmness to support your back.  Make sure to use equipment that fits you, including shoes that fit well.  Be safe and responsible while being active to avoid falls.  Do at least 150 minutes of moderate-intensity exercise each week, such as brisk walking or water aerobics. Try  a form of exercise that takes stress off your back, such as swimming or stationary cycling.  Maintain physical fitness, including: ? Strength. ? Flexibility. ? Cardiovascular fitness. ? Endurance. Contact a health care provider if:  Your back pain does not improve after 6 weeks of treatment.  Your symptoms get worse. Get help right away if:  Your back pain is severe.  You are unable to stand or walk.  You develop pain in your legs.  You develop weakness in your buttocks or legs.  You have difficulty controlling when you urinate or when you have a bowel movement. This information is not intended to replace advice given to you by your health care provider. Make sure you discuss any questions you have with your health care provider. Document Released: 11/17/2005 Document Revised: 07/24/2016 Document Reviewed: 08/29/2015 Elsevier Interactive Patient Education  2017 [employer]  Shoulder Pain Many things can cause shoulder pain, including:  An injury to the area.  Overuse of the shoulder.  Arthritis.  The source of the pain can be:  Inflammation.  An injury to the shoulder joint.  An injury to a tendon, ligament, or bone.  Follow these instructions at home: Take these actions to help with your pain:  Squeeze a soft ball or a foam pad as much as possible. This helps to keep the shoulder from swelling. It also helps to strengthen the arm.  Take over-the-counter and prescription medicines only as told by your health care provider.  If directed, apply ice to the area: ? Put ice in a plastic bag. ? Place a towel between your skin and the bag. ? Leave the ice on for 20  minutes, 2-3 times per day. Stop applying ice if it does not help with the pain.  If you were given a shoulder sling or immobilizer: ? Wear it as told. ? Remove it to shower or bathe. ? Move your arm as little as possible, but keep your hand moving to prevent swelling.  Contact a health care provider if:  Your pain gets worse.  Your pain is not relieved with medicines.  New pain develops in your arm, hand, or fingers. Get help right away if:  Your arm, hand, or fingers: ? Tingle. ? Become numb. ? Become swollen. ? Become painful. ? Turn white or blue. This information is not intended to replace advice given to you by your health care provider. Make sure you discuss any questions you have with your health care provider. Document Released: 08/27/2005 Document Revised: 07/13/2016 Document Reviewed: 03/12/2015 Elsevier Interactive Patient Education  [employer].    If you have lab work done today you will be contacted with your lab results within the next 2 weeks.  If you have not heard from [place] then please contact us. The fastest way to get your results is to register for My Chart.   IF you received an x-ray today, you will receive an invoice from [facility]. Please contact [facility] at [phone] with questions or concerns regarding your invoice.   IF you received labwork today, you will receive an invoice from [facility]. Please contact [facility] at [phone] with questions or concerns regarding your invoice.   Our billing staff will not be able to assist you with questions regarding bills from these companies.  You will be contacted with the lab results as soon as they are available. The fastest way to get your results is to activate your My Chart account. Instructions are located on the  last page of this paperwork. If you have not heard from [place] regarding the results in 2 weeks, please contact this office.

## 2018-12-28 ENCOUNTER — Encounter: Payer: Self-pay | Admitting: Allergy

## 2018-12-28 ENCOUNTER — Ambulatory Visit (INDEPENDENT_AMBULATORY_CARE_PROVIDER_SITE_OTHER): Payer: 59 | Admitting: Allergy

## 2018-12-28 VITALS — BP 132/82 | HR 88 | Temp 98.4°F | Resp 18 | Ht 68.5 in | Wt 336.0 lb

## 2018-12-28 DIAGNOSIS — J3089 Other allergic rhinitis: Secondary | ICD-10-CM | POA: Diagnosis not present

## 2018-12-28 DIAGNOSIS — H1010 Acute atopic conjunctivitis, unspecified eye: Secondary | ICD-10-CM | POA: Diagnosis not present

## 2018-12-28 DIAGNOSIS — L299 Pruritus, unspecified: Secondary | ICD-10-CM

## 2018-12-28 DIAGNOSIS — L508 Other urticaria: Secondary | ICD-10-CM

## 2018-12-28 NOTE — Patient Instructions (Addendum)
Rash and itching  - description and appearance of the rash seems consistent with urticaria (hives).   - at this time etiology of hives and swelling is unknown.  Hives can be caused by a variety of different triggers including illness/infection, foods, medications, stings, exercise, pressure, vibrations, extremes of temperature to name a few however majority of the time there is no identifiable trigger.  Your symptoms have been ongoing for >6 weeks making this chronic thus will obtain labwork to evaluate: CBC w diff, CMP, tryptase, hive panel, environmental panel, alpha-gal panel, inflammatory markers (ESR)  - common food allergy skin testing is negative  - environmental allergy skin testing is negative.  Thus will obtain as above in the lab work to confirm.  - For management of the rash recommend taking the following: Allegra 180 mg twice a day with Pepcid 20 mg twice a day  - If above regimen is not effective enough then will consider adding on Singulair at that time  - We also briefly discussed Xolair monthly injections if high-dose antihistamine regimen is not effective in controlling the urticaria  Seasonal allergic rhinitis with conjunctivitis  - As above we will obtain environmental allergy panel via blood work as skin testing was negative  - Recommend a daily antihistamine as needed as above for allergy symptom control.  - For itchy eyes recommend use of either Patanol 1 drop each eye twice daily as needed or Pataday 1 drop daily as needed or Pazeo 1 drop daily as needed. -For runny nose recommend use of a nasal antihistamine Astelin 1 to 2 sprays each nostril up to twice a day as needed.  Follow-up in 2 to 3 months or sooner if needed

## 2018-12-29 ENCOUNTER — Other Ambulatory Visit: Payer: Self-pay

## 2018-12-29 MED ORDER — AZELASTINE HCL 0.1 % NA SOLN: NASAL | 5 refills | Status: DC

## 2018-12-29 MED ORDER — OLOPATADINE HCL 0.1 % OP SOLN: OPHTHALMIC | 5 refills | Status: DC

## 2018-12-29 MED ORDER — FAMOTIDINE 20 MG PO TABS: 20.0000 mg | ORAL_TABLET | Freq: Two times a day (BID) | ORAL | 5 refills | Status: DC

## 2018-12-29 MED ORDER — FEXOFENADINE HCL 180 MG PO TABS: 180.0000 mg | ORAL_TABLET | Freq: Two times a day (BID) | ORAL | 5 refills | Status: DC

## 2018-12-29 NOTE — Progress Notes (Signed)
New Patient Note  RE: Lauren Daniels MRN: [id number] DOB: 07/23/1976 Date of Office Visit: 12/28/2018  Referring provider: No ref. provider found Primary care provider: Harrison Mons, PA  Chief Complaint: rash and itch  History of present illness: Lauren Daniels is a 43 y.o. female presenting today for evaluation of rash and pruritus.  She presents with her mother today.  She is a former patient of our office with last visit around 2012 for itch and rash.      She states she had what she thought were "bites" before when she was last seen here.  She states these symptoms have returned.  The rash comes and goes.   She also states that she has a lot of itching all over body including scalp.    She states she stopped using soap as was unsure if it was causing the itch.  Thus she just showers with water.  She states she would wipe skin off with alcohol which she states would "cool" the skin but would also burn even though she didn't note any abrasions.  She does feel she has "bites" that are red looking, warm to touch and very itchy.  She states if she didn't do anything for this rash it would last for about a week and did leave dark marks behind.  She states the "bites" have been on her arms and legs primarily.  She states "bites" were hard feeling and did not ooze.   She has used topical cortisone and anti-inflammatory medications for this which did not help.  She also states she use zyrtec 1 tab daily which didn't help.  Benadryl makes her sleepy thus avoids.  Denies any swelling, fevers, joint aches/pains with the rash/itch.  Symptoms occur randomly throughout the year.   She has gone to ED for this rash in 10/2017 and was thought to be having allergic reaction and insect bites and was treated with decadron, pepcid and doxycycline.    She has an appointment with dermatology later this week for the same symptoms.    She recently had a boil on her left buttocks and recalls having  fever and needed draining that she did at home about 2 weeks ago.    She reports having seasonal allergies in spring and fall.    Symptoms include runny nose, itchy eyes and sneezing.  She states she doesn't take anything for these symptoms.     Review of systems: Review of Systems  Constitutional: Negative for chills, fever and malaise/fatigue.  HENT: Negative for congestion, ear discharge, nosebleeds and sore throat.   Eyes: Negative for pain, discharge and redness.  Respiratory: Negative for cough, shortness of breath and wheezing.   Cardiovascular: Negative for chest pain.  Gastrointestinal: Negative for abdominal pain, constipation, diarrhea, nausea and vomiting.  Musculoskeletal: Negative for joint pain.  Skin: Positive for itching and rash.  Neurological: Negative for headaches.    All other systems negative unless noted above in HPI  Past medical history: Past Medical History:  Diagnosis Date  . Allergy   . Anemia   . Back spasm   . Depression   . Fibroid   . HSV-2 (herpes simplex virus 2) infection   . Joint pain   . Knee problem   . Migraine 10/19/2014  . Obesity   . Shortness of breath   . Sleep apnea   . Vitamin D deficiency   . Vitamin deficiency    HX OF LOW VIT D  Past surgical history: Past Surgical History:  Procedure Laterality Date  . TUMOR REMOVAL     fibroid  . VAGINAL DELIVERY     x 1    Family history:  Family History  Problem Relation Age of Onset  . Breast cancer Mother 54  . Thyroid disease Mother   . Obesity Mother   . Diabetes Mother   . Breast cancer Maternal Grandmother 54  . Colon cancer Maternal Grandmother   . Diabetes Father   . Heart disease Father   . Hyperlipidemia Father   . Hypertension Father   . Sleep apnea Father   . Alcoholism Father   . Obesity Father   . Breast cancer Maternal Aunt   . Breast cancer Maternal Aunt   . Migraines Son   . Diabetes Paternal Grandmother   . Migraines Brother     Social  history: Lives in an apartment with carpeting with electric heating and central cooling.  No pets in the home.  No concern for water damage, mildew or roaches in the home.  She works as an Paediatric nurse.  Denies a smoking history.  Medication List: Allergies as of 12/28/2018      Reactions   Eye Drops [naphazoline-polyethyl Glycol] Swelling      Medication List       Accurate as of December 28, 2018 11:59 PM. Always use your most recent med list.        MULTI-VITAMINS Tabs Take by mouth.       Known medication allergies: Allergies  Allergen Reactions  . Eye Drops [Naphazoline-Polyethyl Glycol] Swelling     Physical examination: Blood pressure 132/82, pulse 88, temperature 98.4 F (36.9 C), temperature source Oral, resp. rate 18, height 5' 8.5" (1.74 m), weight (!) 336 lb (152.4 kg).  General: Alert, interactive, in no acute distress. HEENT: PERRLA, TMs pearly gray, turbinates non-edematous without discharge, post-pharynx non erythematous. Neck: Supple without lymphadenopathy. Lungs: Clear to auscultation without wheezing, rhonchi or rales. {no increased work of breathing. CV: Normal S1, S2 without murmurs. Abdomen: Nondistended, nontender. Skin: Warm and dry, without lesions or rashes.  No visible or palpable rash today Extremities:  No clubbing, cyanosis or edema. Neuro:   Grossly intact.  Diagnositics/Labs:  Allergy testing: Environmental allergy skin prick testing is negative. Common food allergen panel skin prick testing is negative today Histamine control was positive Allergy testing results were read and interpreted by provider, documented by clinical staff.   Assessment and plan:   Urticaria, chronic  - description and appearance of the rash seems consistent with urticaria (hives).  She did provide with a picture of a large urticarial lesion on her shoulder.  It did look more consistent with urticaria than with a insect bite.  Given the recurrence of  this rash it be more consistent with urticaria than insect bite.  He has had no concern for bedbugs or other insects in the home.  - at this time etiology of hives and swelling is unknown.  Hives can be caused by a variety of different triggers including illness/infection, foods, medications, stings, exercise, pressure, vibrations, extremes of temperature to name a few however majority of the time there is no identifiable trigger.  Your symptoms have been ongoing for >6 weeks making this chronic thus will obtain labwork to evaluate: CBC w diff, CMP, tryptase, hive panel, environmental panel, alpha-gal panel, inflammatory markers (ESR)  - common food allergy skin testing is negative  - environmental allergy skin testing is negative.  Thus  will obtain as above in the lab work to confirm.  - For management of the rash recommend taking the following: Allegra 180 mg twice a day with Pepcid 20 mg twice a day  - If above regimen is not effective enough then will consider adding on Singulair at that time  - We also briefly discussed Xolair monthly injections if high-dose antihistamine regimen is not effective in controlling the urticaria  Seasonal allergic rhinitis with conjunctivitis  - As above we will obtain environmental allergy panel via blood work as skin testing was negative  - Recommend a daily antihistamine as needed as above for allergy symptom control.  - For itchy eyes recommend use of either Patanol 1 drop each eye twice daily as needed or Pataday 1 drop daily as needed or Pazeo 1 drop daily as needed. -For runny nose recommend use of a nasal antihistamine Astelin 1 to 2 sprays each nostril up to twice a day as needed.  Follow-up in 2 to 3 months or sooner if needed  I appreciate the opportunity to take part in Lauren Daniels's care. Please do not hesitate to contact me with questions.  Sincerely,   Prudy Feeler, MD Allergy/Immunology [facility]

## 2019-01-04 ENCOUNTER — Other Ambulatory Visit: Payer: Self-pay

## 2019-01-04 MED ORDER — ALCAFTADINE 0.25 % OP SOLN: OPHTHALMIC | 5 refills | Status: DC

## 2019-03-18 ENCOUNTER — Emergency Department (HOSPITAL_COMMUNITY)
Admission: EM | Admit: 2019-03-18 | Discharge: 2019-03-18 | Disposition: A | Discharge status: Home / Self Care | Payer: 59 | Attending: Emergency Medicine | Admitting: Emergency Medicine

## 2019-03-18 ENCOUNTER — Other Ambulatory Visit: Payer: Self-pay

## 2019-03-18 ENCOUNTER — Emergency Department (HOSPITAL_COMMUNITY): Payer: 59

## 2019-03-18 ENCOUNTER — Encounter (HOSPITAL_COMMUNITY): Payer: Self-pay | Admitting: Emergency Medicine

## 2019-03-18 DIAGNOSIS — D219 Benign neoplasm of connective and other soft tissue, unspecified: Secondary | ICD-10-CM

## 2019-03-18 DIAGNOSIS — D259 Leiomyoma of uterus, unspecified: Secondary | ICD-10-CM | POA: Diagnosis not present

## 2019-03-18 DIAGNOSIS — N938 Other specified abnormal uterine and vaginal bleeding: Secondary | ICD-10-CM | POA: Diagnosis not present

## 2019-03-18 DIAGNOSIS — R1030 Lower abdominal pain, unspecified: Secondary | ICD-10-CM | POA: Diagnosis not present

## 2019-03-18 DIAGNOSIS — Z79899 Other long term (current) drug therapy: Secondary | ICD-10-CM | POA: Diagnosis not present

## 2019-03-18 DIAGNOSIS — R102 Pelvic and perineal pain: Secondary | ICD-10-CM | POA: Diagnosis present

## 2019-03-18 LAB — COMPREHENSIVE METABOLIC PANEL
ALT: 13 U/L (ref 0–44)
AST: 18 U/L (ref 15–41)
Albumin: 4.4 g/dL (ref 3.5–5.0)
Alkaline Phosphatase: 58 U/L (ref 38–126)
Anion gap: 9 (ref 5–15)
BUN: 7 mg/dL (ref 6–20)
CO2: 21 mmol/L — ABNORMAL LOW (ref 22–32)
Calcium: 9 mg/dL (ref 8.9–10.3)
Chloride: 106 mmol/L (ref 98–111)
Creatinine, Ser: 0.75 mg/dL (ref 0.44–1.00)
GFR calc Af Amer: >60 mL/min (ref >60)
GFR calc non Af Amer: >60 mL/min (ref >60)
Glucose, Bld: 109 mg/dL — ABNORMAL HIGH (ref 70–99)
Potassium: 3.5 mmol/L (ref 3.5–5.1)
Sodium: 136 mmol/L (ref 135–145)
Total Bilirubin: 0.5 mg/dL (ref 0.3–1.2)
Total Protein: 7.9 g/dL (ref 6.5–8.1)

## 2019-03-18 LAB — CBC WITH DIFFERENTIAL/PLATELET
Abs Immature Granulocytes: 0.02 10*3/uL (ref 0.00–0.07)
Basophils Absolute: 0 10*3/uL (ref 0.0–0.1)
Basophils Relative: 0 %
Eosinophils Absolute: 0 10*3/uL (ref 0.0–0.5)
Eosinophils Relative: 0 %
HCT: 43.6 % (ref 36.0–46.0)
Hemoglobin: 13.4 g/dL (ref 12.0–15.0)
Immature Granulocytes: 0 %
Lymphocytes Relative: 25 %
Lymphs Abs: 1.6 10*3/uL (ref 0.7–4.0)
MCH: 26.4 pg (ref 26.0–34.0)
MCHC: 30.7 g/dL (ref 30.0–36.0)
MCV: 85.8 fL (ref 80.0–100.0)
Monocytes Absolute: 0.4 10*3/uL (ref 0.1–1.0)
Monocytes Relative: 7 %
Neutro Abs: 4.3 10*3/uL (ref 1.7–7.7)
Neutrophils Relative %: 68 %
Platelets: 394 10*3/uL (ref 150–400)
RBC: 5.08 MIL/uL (ref 3.87–5.11)
RDW: 14.5 % (ref 11.5–15.5)
WBC: 6.3 10*3/uL (ref 4.0–10.5)
nRBC: 0 % (ref 0.0–0.2)

## 2019-03-18 LAB — I-STAT BETA HCG BLOOD, ED (MC, WL, AP ONLY): I-stat hCG, quantitative: <5 m[IU]/mL (ref <5)

## 2019-03-18 LAB — WET PREP, GENITAL
Sperm: NONE SEEN
Trich, Wet Prep: NONE SEEN
Yeast Wet Prep HPF POC: NONE SEEN

## 2019-03-18 LAB — URINALYSIS, ROUTINE W REFLEX MICROSCOPIC
Bilirubin Urine: NEGATIVE
Glucose, UA: NEGATIVE mg/dL
Hgb urine dipstick: NEGATIVE
Ketones, ur: NEGATIVE mg/dL
Leukocytes,Ua: NEGATIVE
Nitrite: NEGATIVE
Protein, ur: NEGATIVE mg/dL
Specific Gravity, Urine: 1.01 (ref 1.005–1.030)
pH: 8 (ref 5.0–8.0)

## 2019-03-18 LAB — LIPASE, BLOOD: Lipase: 39 U/L (ref 11–51)

## 2019-03-18 MED ORDER — HYDROMORPHONE HCL 1 MG/ML IJ SOLN
1.0000 mg | Freq: Once | INTRAMUSCULAR | Status: AC
  Administered 2019-03-18: 1 mg via INTRAVENOUS
  Filled 2019-03-18: qty 1

## 2019-03-18 MED ORDER — ONDANSETRON 4 MG PO TBDP: 4.0000 mg | ORAL_TABLET | Freq: Three times a day (TID) | ORAL | 0 refills | Status: DC | PRN

## 2019-03-18 MED ORDER — SODIUM CHLORIDE 0.9 % IV BOLUS
1000.0000 mL | Freq: Once | INTRAVENOUS | Status: AC
  Administered 2019-03-18: 1000 mL via INTRAVENOUS

## 2019-03-18 MED ORDER — KETOROLAC TROMETHAMINE 15 MG/ML IJ SOLN
15.0000 mg | Freq: Once | INTRAMUSCULAR | Status: AC
  Administered 2019-03-18: 15 mg via INTRAVENOUS
  Filled 2019-03-18: qty 1

## 2019-03-18 MED ORDER — IBUPROFEN 600 MG PO TABS: 600.0000 mg | ORAL_TABLET | Freq: Four times a day (QID) | ORAL | 0 refills | Status: DC | PRN

## 2019-03-18 MED ORDER — ONDANSETRON HCL 4 MG/2ML IJ SOLN
4.0000 mg | Freq: Once | INTRAMUSCULAR | Status: AC
  Administered 2019-03-18: 4 mg via INTRAVENOUS
  Filled 2019-03-18: qty 2

## 2019-03-18 MED ORDER — HYDROCODONE-ACETAMINOPHEN 5-325 MG PO TABS: 1.0000 | ORAL_TABLET | Freq: Four times a day (QID) | ORAL | 0 refills | Status: DC | PRN

## 2019-03-18 NOTE — ED Notes (Signed)
EDP at bedside to perform pelvic exam.

## 2019-03-18 NOTE — ED Triage Notes (Signed)
Pt c/o abd pains that got really bad at 5am this morning with nausea. Reports vomited once. Denies diarrhea. Reports took milk of Mag to try to have BM to see if help with pains. reports her menstrual cycle has been off for several months and had bleeding yesterday but none today.

## 2019-03-18 NOTE — ED Provider Notes (Signed)
[facility] DEPT Provider Note   CSN: [id number] Arrival date & time: 03/18/19  1018    History   Chief Complaint Chief Complaint  Patient presents with  . Abdominal Pain  . Emesis    HPI Lauren Daniels is a 43 y.o. female.     HPI   42 yo F with PMHx as below here with abd pain. Pt states that she awoke around 5 AM this morning with severe, aching, cramping, lower pelvic and abd pain. Pain is constant, intermittently worsens however, and is worse w/ movement, palpation. She thought she had to have a BM so took mag citrate w/o relief. She also reports that over the past year, she's had irregular periods and did begin bleeding yesterday, though her bleeding has stopped today (vaginally). No blood in stool. No fever or chills. No cough. No diarrhea. She's had nausea from pain but no vomiting.  Past Medical History:  Diagnosis Date  . Allergy   . Anemia   . Back spasm   . Depression   . Fibroid   . HSV-2 (herpes simplex virus 2) infection   . Joint pain   . Knee problem   . Migraine 10/19/2014  . Obesity   . Shortness of breath   . Sleep apnea   . Vitamin D deficiency   . Vitamin deficiency    HX OF LOW VIT D     Patient Active Problem List   Diagnosis Date Noted  . Hiatal hernia 03/31/2017  . Sleep disturbance 03/31/2017  . Abnormal mammogram of left breast 12/01/2014  . Migraine 10/19/2014  . Appetite disorder 10/13/2013  . Extreme obesity 10/13/2013  . Back spasm 04/14/2013  . Fibroid   . Vitamin D deficiency   . HSV-2 (herpes simplex virus 2) infection     Past Surgical History:  Procedure Laterality Date  . TUMOR REMOVAL     fibroid  . VAGINAL DELIVERY     x 1     OB History    Gravida  4   Para  2   Term  1   Preterm  1   AB  2   Living  1     SAB  2   TAB      Ectopic      Multiple      Live Births  1            Home Medications    Prior to Admission medications   Medication Sig  Start Date End Date Taking? Authorizing Provider  fexofenadine (ALLEGRA) 180 MG tablet Take 1 tablet (180 mg total) by mouth 2 (two) times daily. 12/29/18  Yes Padgett, Rae Halsted, MD  Multiple Vitamin (MULTI-VITAMINS) TABS Take by mouth.   Yes [provider]  Alcaftadine (LASTACAFT) 0.25 % SOLN Use one drop in each eye once daily as directed. Patient not taking: Reported on 03/18/2019 01/04/19   Kennith Gain, MD  azelastine (ASTELIN) 0.1 % nasal spray Can use one to two sprays in each nostril twice daily if needed for runny nose. Patient not taking: Reported on 03/18/2019 12/29/18   Kennith Gain, MD  famotidine (PEPCID) 20 MG tablet Take 1 tablet (20 mg total) by mouth 2 (two) times daily. Patient not taking: Reported on 03/18/2019 12/29/18   Kennith Gain, MD  HYDROcodone-acetaminophen (NORCO/VICODIN) 5-325 MG tablet Take 1-2 tablets by mouth every 6 (six) hours as needed for moderate pain or severe pain. 03/18/19  Duffy Bruce, MD  ibuprofen (ADVIL) 600 MG tablet Take 1 tablet (600 mg total) by mouth every 6 (six) hours as needed for moderate pain. 03/18/19   Duffy Bruce, MD  olopatadine (PATANOL) 0.1 % ophthalmic solution Can use one drop in each eye twice daily if needed for itchy eyes. Patient not taking: Reported on 03/18/2019 12/29/18   Kennith Gain, MD  ondansetron (ZOFRAN ODT) 4 MG disintegrating tablet Take 1 tablet (4 mg total) by mouth every 8 (eight) hours as needed for nausea or vomiting. 03/18/19   Duffy Bruce, MD    Family History Family History  Problem Relation Age of Onset  . Breast cancer Mother 15  . Thyroid disease Mother   . Obesity Mother   . Diabetes Mother   . Breast cancer Maternal Grandmother 52  . Colon cancer Maternal Grandmother   . Diabetes Father   . Heart disease Father   . Hyperlipidemia Father   . Hypertension Father   . Sleep apnea Father   . Alcoholism Father   . Obesity Father   .  Breast cancer Maternal Aunt   . Breast cancer Maternal Aunt   . Migraines Son   . Diabetes Paternal Grandmother   . Migraines Brother     Social History Social History   Tobacco Use  . Smoking status: Never Smoker  . Smokeless tobacco: Never Used  Substance Use Topics  . Alcohol use: Not Currently    Comment: occassional   . Drug use: No     Allergies   Eye drops [naphazoline-polyethyl glycol]   Review of Systems Review of Systems  Constitutional: Positive for fatigue. Negative for chills and fever.  HENT: Negative for congestion and rhinorrhea.   Eyes: Negative for visual disturbance.  Respiratory: Negative for cough, shortness of breath and wheezing.   Cardiovascular: Negative for chest pain and leg swelling.  Gastrointestinal: Positive for abdominal pain, diarrhea, nausea and vomiting.  Genitourinary: Negative for dysuria and flank pain.  Musculoskeletal: Negative for neck pain and neck stiffness.  Skin: Negative for rash and wound.  Allergic/Immunologic: Negative for immunocompromised state.  Neurological: Positive for weakness. Negative for syncope and headaches.  All other systems reviewed and are negative.    Physical Exam Updated Vital Signs BP 134/77 (BP Location: Right Arm)   Pulse 69   Resp 20   SpO2 100%   Physical Exam Vitals signs and nursing note reviewed.  Constitutional:      General: She is not in acute distress.    Appearance: She is well-developed.     Comments: Appears uncomfortable and in pain  HENT:     Head: Normocephalic and atraumatic.  Eyes:     Conjunctiva/sclera: Conjunctivae normal.  Neck:     Musculoskeletal: Neck supple.  Cardiovascular:     Rate and Rhythm: Normal rate and regular rhythm.     Heart sounds: Normal heart sounds. No murmur. No friction rub.  Pulmonary:     Effort: Pulmonary effort is normal. No respiratory distress.     Breath sounds: Normal breath sounds. No wheezing or rales.  Abdominal:     General:  There is no distension.     Palpations: Abdomen is soft.     Tenderness: There is abdominal tenderness in the suprapubic area.  Skin:    General: Skin is warm.     Capillary Refill: Capillary refill takes less than 2 seconds.  Neurological:     Mental Status: She is oriented to person, place, and  time.     Motor: No abnormal muscle tone.      ED Treatments / Results  Labs (all labs ordered are listed, but only abnormal results are displayed) Labs Reviewed  WET PREP, GENITAL - Abnormal; Notable for the following components:      Result Value   Clue Cells Wet Prep HPF POC PRESENT (*)    WBC, Wet Prep HPF POC FEW (*)    All other components within normal limits  COMPREHENSIVE METABOLIC PANEL - Abnormal; Notable for the following components:   CO2 21 (*)    Glucose, Bld 109 (*)    All other components within normal limits  URINALYSIS, ROUTINE W REFLEX MICROSCOPIC - Abnormal; Notable for the following components:   Color, Urine STRAW (*)    All other components within normal limits  CBC WITH DIFFERENTIAL/PLATELET  LIPASE, BLOOD  I-STAT BETA HCG BLOOD, ED ([facility], [facility], [facility] ONLY)  GC/CHLAMYDIA PROBE AMP ([place]) NOT AT [facility]    EKG None  Radiology US Transvaginal Non-ob  Result Date: 03/18/2019 CLINICAL DATA:  Severe pelvic pain for the past day. EXAM: TRANSABDOMINAL AND TRANSVAGINAL ULTRASOUND OF PELVIS DOPPLER ULTRASOUND OF OVARIES TECHNIQUE: Both transabdominal and transvaginal ultrasound examinations of the pelvis were performed. Transabdominal technique was performed for global imaging of the pelvis including uterus, ovaries, adnexal regions, and pelvic cul-de-sac. It was necessary to proceed with endovaginal exam following the transabdominal exam to visualize the endometrium, ovaries, and adnexa. Color and duplex Doppler ultrasound was utilized to evaluate blood flow to the ovaries. COMPARISON:  Pelvic MRI dated August 11, 2009. FINDINGS: Uterus Measurements: 16.8 x 7.2 x 7.5  cm = volume: 1034 mL. Multiple uterine fibroids, the largest measuring 4.8 x 3.9 x 4.6 cm. Endometrium Thickness: 20 mm.  No focal abnormality visualized. Right ovary Measurements: 3.5 x 2.2 x 2.9 cm = volume: 12 mL. Normal appearance/no adnexal mass. Left ovary Measurements: 3.8 x 3.1 x 3.0 cm = volume: 19 mL. Normal appearance/no adnexal mass. Dominant follicle. Pulsed Doppler evaluation of both ovaries demonstrates normal low-resistance arterial and venous waveforms. Other findings No abnormal free fluid. IMPRESSION: 1. Enlarged, fibroid uterus. 2. Dominant left ovarian follicle.  No ovarian cyst. 3. Endometrial thickness is considered abnormal. Consider follow-up by [place] in 6-8 weeks, during the week immediately following menses (exam timing is critical). Electronically Signed   By: Titus Dubin M.D.   On: 03/18/2019 13:36   US Pelvis Complete  Result Date: 03/18/2019 CLINICAL DATA:  Severe pelvic pain for the past day. EXAM: TRANSABDOMINAL AND TRANSVAGINAL ULTRASOUND OF PELVIS DOPPLER ULTRASOUND OF OVARIES TECHNIQUE: Both transabdominal and transvaginal ultrasound examinations of the pelvis were performed. Transabdominal technique was performed for global imaging of the pelvis including uterus, ovaries, adnexal regions, and pelvic cul-de-sac. It was necessary to proceed with endovaginal exam following the transabdominal exam to visualize the endometrium, ovaries, and adnexa. Color and duplex Doppler ultrasound was utilized to evaluate blood flow to the ovaries. COMPARISON:  Pelvic MRI dated August 11, 2009. FINDINGS: Uterus Measurements: 16.8 x 7.2 x 7.5 cm = volume: 1034 mL. Multiple uterine fibroids, the largest measuring 4.8 x 3.9 x 4.6 cm. Endometrium Thickness: 20 mm.  No focal abnormality visualized. Right ovary Measurements: 3.5 x 2.2 x 2.9 cm = volume: 12 mL. Normal appearance/no adnexal mass. Left ovary Measurements: 3.8 x 3.1 x 3.0 cm = volume: 19 mL. Normal appearance/no adnexal mass.  Dominant follicle. Pulsed Doppler evaluation of both ovaries demonstrates normal low-resistance arterial and venous waveforms. Other  findings No abnormal free fluid. IMPRESSION: 1. Enlarged, fibroid uterus. 2. Dominant left ovarian follicle.  No ovarian cyst. 3. Endometrial thickness is considered abnormal. Consider follow-up by [place] in 6-8 weeks, during the week immediately following menses (exam timing is critical). Electronically Signed   By: Titus Dubin M.D.   On: 03/18/2019 13:36   [place] Art/ven Flow Abd Pelv Doppler  Result Date: 03/18/2019 CLINICAL DATA:  Severe pelvic pain for the past day. EXAM: TRANSABDOMINAL AND TRANSVAGINAL ULTRASOUND OF PELVIS DOPPLER ULTRASOUND OF OVARIES TECHNIQUE: Both transabdominal and transvaginal ultrasound examinations of the pelvis were performed. Transabdominal technique was performed for global imaging of the pelvis including uterus, ovaries, adnexal regions, and pelvic cul-de-sac. It was necessary to proceed with endovaginal exam following the transabdominal exam to visualize the endometrium, ovaries, and adnexa. Color and duplex Doppler ultrasound was utilized to evaluate blood flow to the ovaries. COMPARISON:  Pelvic MRI dated August 11, 2009. FINDINGS: Uterus Measurements: 16.8 x 7.2 x 7.5 cm = volume: 1034 mL. Multiple uterine fibroids, the largest measuring 4.8 x 3.9 x 4.6 cm. Endometrium Thickness: 20 mm.  No focal abnormality visualized. Right ovary Measurements: 3.5 x 2.2 x 2.9 cm = volume: 12 mL. Normal appearance/no adnexal mass. Left ovary Measurements: 3.8 x 3.1 x 3.0 cm = volume: 19 mL. Normal appearance/no adnexal mass. Dominant follicle. Pulsed Doppler evaluation of both ovaries demonstrates normal low-resistance arterial and venous waveforms. Other findings No abnormal free fluid. IMPRESSION: 1. Enlarged, fibroid uterus. 2. Dominant left ovarian follicle.  No ovarian cyst. 3. Endometrial thickness is considered abnormal. Consider follow-up by [place] in 6-8  weeks, during the week immediately following menses (exam timing is critical). Electronically Signed   By: Titus Dubin M.D.   On: 03/18/2019 13:36    Procedures Procedures (including critical care time)  Medications Ordered in ED Medications  sodium chloride 0.9 % bolus 1,000 mL (0 mLs Intravenous Stopped 03/18/19 1306)  ondansetron (ZOFRAN) injection 4 mg (4 mg Intravenous Given 03/18/19 1052)  HYDROmorphone (DILAUDID) injection 1 mg (1 mg Intravenous Given 03/18/19 1054)  ketorolac (TORADOL) 15 MG/ML injection 15 mg (15 mg Intravenous Given 03/18/19 1116)     Initial Impression / Assessment and Plan / ED Course  I have reviewed the triage vital signs and the nursing notes.  Pertinent labs & imaging results that were available during my care of the patient were reviewed by me and considered in my medical decision making (see chart for details).  Clinical Course as of Mar 17 1406  Fri Mar 18, 2019  1107 43 yo F here w/ severe lower pelvic pain. Given h/o recent irregular menstrual/vaginal bleeding, I suspect this could be DUB, anovulatory bleed, hemorrhagic cyst. Less likely torsion though also a consideration. No fever, dyspareunia, or s/s PID. No upper abd, RLQ TTP to suggest appendicitis, diverticulitis. U/S pending. UA neg, doubt UTI.   [CI]  1203 Pelvic exam shows moderate blood in vaginal vault, small clot in os, otherwise no CMT, no adnexal pain. Labs ressuring - CBC with normal Hgb, CMP with mild dehydration but normal renal function and LFTs. UA without UTI. Lipase normal. HCG neg. Awaiting U/S. Sx improved with toradol.   [CI]  1205 Denies any vaginal discharge, likely asymptomatic clue cells.  Wet prep, genital(!) [CI]    Clinical Course User Index [CI] Duffy Bruce, MD       U/S shows ifbroids, ovarian follicle, thickened endometrium which I suspect is 2/2 menstruation. This was discussed w/ pt, she will f/u  with her OB. Bleeding controlled and pain is resolved.   Final Clinical Impressions(s) / ED Diagnoses   Final diagnoses:  DUB (dysfunctional uterine bleeding)  Fibroids    ED Discharge Orders         Ordered    ibuprofen (ADVIL) 600 MG tablet  Every 6 hours PRN     03/18/19 1405    HYDROcodone-acetaminophen (NORCO/VICODIN) 5-325 MG tablet  Every 6 hours PRN     03/18/19 1405    ondansetron (ZOFRAN ODT) 4 MG disintegrating tablet  Every 8 hours PRN     03/18/19 1406           Duffy Bruce, MD 03/18/19 1407

## 2019-03-18 NOTE — ED Notes (Signed)
Attempted to call ultrasound. No answer. Requested Network engineer to page [place].

## 2019-03-18 NOTE — ED Notes (Signed)
Patient ambulated to restroom with no assistance and no problems with gait. UA provided.

## 2019-03-18 NOTE — ED Notes (Addendum)
Pt d/c home per MD order. Discharge summary reviewed, pt verbalizes understanding. Pt voicing no complaints at d/c. Son is here for discharge ride home. Pt ambulatory off unit.

## 2019-03-18 NOTE — ED Notes (Signed)
Pt voided in bedside toilet. Blood noted, one small size clot (nickel) noted. EDP made aware.

## 2019-03-18 NOTE — ED Notes (Signed)
Isaacs, MD at bedside. 

## 2019-03-18 NOTE — ED Notes (Signed)
Pelvic cart set up at bedside and ready. MD made aware.

## 2019-03-21 LAB — GC/CHLAMYDIA PROBE AMP (~~LOC~~) NOT AT ARMC
Chlamydia: NEGATIVE
Neisseria Gonorrhea: NEGATIVE

## 2019-12-27 ENCOUNTER — Other Ambulatory Visit: Payer: Self-pay

## 2019-12-27 ENCOUNTER — Ambulatory Visit (INDEPENDENT_AMBULATORY_CARE_PROVIDER_SITE_OTHER): Payer: 59 | Admitting: Family Medicine

## 2019-12-27 ENCOUNTER — Encounter: Payer: Self-pay | Admitting: Family Medicine

## 2019-12-27 VITALS — BP 110/70 | HR 71 | Temp 97.7°F | Ht 68.5 in | Wt 311.4 lb

## 2019-12-27 DIAGNOSIS — S61209A Unspecified open wound of unspecified finger without damage to nail, initial encounter: Secondary | ICD-10-CM

## 2019-12-27 DIAGNOSIS — Z23 Encounter for immunization: Secondary | ICD-10-CM | POA: Diagnosis not present

## 2019-12-27 DIAGNOSIS — N920 Excessive and frequent menstruation with regular cycle: Secondary | ICD-10-CM | POA: Insufficient documentation

## 2019-12-27 DIAGNOSIS — S61200A Unspecified open wound of right index finger without damage to nail, initial encounter: Secondary | ICD-10-CM | POA: Diagnosis not present

## 2019-12-27 MED ORDER — CEPHALEXIN 500 MG PO CAPS: 500.0000 mg | ORAL_CAPSULE | Freq: Three times a day (TID) | ORAL | 0 refills | Status: DC

## 2019-12-27 NOTE — Patient Instructions (Addendum)
Soak the fingertip in a cup of warm water without or with Epsom salts 4- 5 times daily as discussed.  Keep a clean Band-Aid on it.  If you think it is getting red and infected, go ahead and start taking the cephalexin (Keflex).  Otherwise through the prescription away.  Return as needed.  Tetanus shot today.   If you have lab work done today you will be contacted with your lab results within the next 2 weeks.  If you have not heard from [place] then please contact us. The fastest way to get your results is to register for My Chart.   IF you received an x-ray today, you will receive an invoice from [facility]. Please contact [facility] at [phone] with questions or concerns regarding your invoice.   IF you received labwork today, you will receive an invoice from [facility]. Please contact [facility] at [phone] with questions or concerns regarding your invoice.   Our billing staff will not be able to assist you with questions regarding bills from these companies.  You will be contacted with the lab results as soon as they are available. The fastest way to get your results is to activate your My Chart account. Instructions are located on the last page of this paperwork. If you have not heard from [place] regarding the results in 2 weeks, please contact this office.

## 2019-12-27 NOTE — Progress Notes (Signed)
Patient ID: Lauren Daniels, female    DOB: 21-Jul-1976  Age: 44 y.o. MRN: [id number]  Chief Complaint  Patient presents with  . Hand Pain    digging in box, cut finger on right want tetanus shot    Subjective:   Patient was actually getting a broken piece off a faucet and cut her hand on the right index fingertip.  She can see a little black streak in there and think she has a little chard of ceramic or metal in there.  That happened this morning.  She thinks is been over 10 years since she had a tetanus shot.  Current allergies, medications, problem list, past/family and social histories reviewed.  Objective:  BP 110/70   Pulse 71   Temp 97.7 F (36.5 C)   Ht 5' 8.5" (1.74 m)   Wt (!) 311 lb 6.4 oz (141.3 kg)   LMP 12/08/2019   SpO2 98%   BMI 46.66 kg/m   I tiny superficial cut of the skin with a little 4 mm area of dark blue-black streak just above it.  It looked like a foreign body superficially.  Procedure note: Superficial I&D was done.  There was no foreign body.  It was merely a streak of oil or something that caused the pigment appearance.  Probably had had a sharp object puncture her and come back out.  With a very shallow incision over the dark streak, only dark stuff was wiped out and the wound looks fine.  See instructions.  Assessment & Plan:   Assessment: 1. Open wound of finger, initial encounter       Plan: See instructions.  Gave her prescription for antibiotic if it does start to look infected but I do not think it will be necessary.  Orders Placed This Encounter  Procedures  . Td vaccine greater than or equal to 7yo preservative free IM    Meds ordered this encounter  Medications  . cephALEXin (KEFLEX) 500 MG capsule    Sig: Take 1 capsule (500 mg total) by mouth 3 (three) times daily.    Dispense:  15 capsule    Refill:  0         Patient Instructions   Soak the fingertip in a cup of warm water without or with Epsom salts 4- 5 times  daily as discussed.  Keep a clean Band-Aid on it.  If you think it is getting red and infected, go ahead and start taking the cephalexin (Keflex).  Otherwise through the prescription away.  Return as needed.  Tetanus shot today.   If you have lab work done today you will be contacted with your lab results within the next 2 weeks.  If you have not heard from [place] then please contact us. The fastest way to get your results is to register for My Chart.   IF you received an x-ray today, you will receive an invoice from [facility]. Please contact [facility] at [phone] with questions or concerns regarding your invoice.   IF you received labwork today, you will receive an invoice from [facility]. Please contact [facility] at [phone] with questions or concerns regarding your invoice.   Our billing staff will not be able to assist you with questions regarding bills from these companies.  You will be contacted with the lab results as soon as they are available. The fastest way to get your results is to activate your My Chart account. Instructions are located on the last page of this  paperwork. If you have not heard from [place] regarding the results in 2 weeks, please contact this office.        Return if symptoms worsen or fail to improve.   Ruben Reason, MD 12/27/2019

## 2021-09-26 ENCOUNTER — Encounter: Payer: Self-pay | Admitting: Gastroenterology

## 2021-10-08 ENCOUNTER — Encounter: Payer: Self-pay | Admitting: Gastroenterology

## 2021-10-08 ENCOUNTER — Other Ambulatory Visit: Payer: Self-pay

## 2021-10-08 ENCOUNTER — Ambulatory Visit (AMBULATORY_SURGERY_CENTER): Payer: BC Managed Care – PPO | Admitting: *Deleted

## 2021-10-08 VITALS — Ht 70.5 in | Wt 325.0 lb

## 2021-10-08 DIAGNOSIS — Z1211 Encounter for screening for malignant neoplasm of colon: Secondary | ICD-10-CM

## 2021-10-08 DIAGNOSIS — Z800 Family history of malignant neoplasm of digestive organs: Secondary | ICD-10-CM

## 2021-10-08 MED ORDER — NA SULFATE-K SULFATE-MG SULF 17.5-3.13-1.6 GM/177ML PO SOLN: 1.0000 | ORAL | 0 refills | Status: DC

## 2021-10-08 NOTE — Progress Notes (Signed)

## 2021-10-21 ENCOUNTER — Other Ambulatory Visit: Payer: Self-pay | Admitting: Gastroenterology

## 2021-10-21 ENCOUNTER — Encounter: Payer: Self-pay | Admitting: Gastroenterology

## 2021-10-21 ENCOUNTER — Ambulatory Visit (AMBULATORY_SURGERY_CENTER): Payer: BC Managed Care – PPO | Admitting: Gastroenterology

## 2021-10-21 VITALS — BP 131/74 | HR 79 | Temp 98.4°F | Resp 23 | Ht 70.5 in | Wt 325.0 lb

## 2021-10-21 DIAGNOSIS — Z1211 Encounter for screening for malignant neoplasm of colon: Secondary | ICD-10-CM | POA: Diagnosis not present

## 2021-10-21 DIAGNOSIS — D122 Benign neoplasm of ascending colon: Secondary | ICD-10-CM

## 2021-10-21 DIAGNOSIS — Z800 Family history of malignant neoplasm of digestive organs: Secondary | ICD-10-CM | POA: Diagnosis not present

## 2021-10-21 MED ORDER — SODIUM CHLORIDE 0.9 % IV SOLN: 500.0000 mL | Freq: Once | INTRAVENOUS | Status: DC

## 2021-10-21 NOTE — Progress Notes (Signed)
[facility] History and Physical   Primary Care Physician:  Harrison Mons, PA   Reason for Procedure:   Family history of colon cancer  Plan:    colonoscopy     HPI: Lauren Daniels is a 45 y.o. female  here for colonoscopy screening - first time exam. Father had colon cancer dx age 27 or so. Patient denies any bowel symptoms at this time.  Otherwise feels well without any cardiopulmonary symptoms. BMI is 49. We have discussed risks / benefits of colonoscopy and anesthesia and she wishes to proceed. All questions answered.   Past Medical History:  Diagnosis Date   Allergy    Anemia    Back spasm    Depression    Fibroid    HSV-2 (herpes simplex virus 2) infection    Joint pain    Knee problem    Migraine 10/19/2014   Obesity    Shortness of breath    Sleep apnea    Vitamin D deficiency    Vitamin deficiency    HX OF LOW VIT D     Past Surgical History:  Procedure Laterality Date   TUMOR REMOVAL     fibroid   VAGINAL DELIVERY     x 1    Prior to Admission medications   Medication Sig Start Date End Date Taking? Authorizing Provider  azelastine (ASTELIN) 0.1 % nasal spray Can use one to two sprays in each nostril twice daily if needed for runny nose. Patient not taking: Reported on 10/08/2021 12/29/18   Kennith Gain, MD  Multiple Vitamin (MULTI-VITAMINS) TABS Take by mouth.    [provider]    Current Outpatient Medications  Medication Sig Dispense Refill   azelastine (ASTELIN) 0.1 % nasal spray Can use one to two sprays in each nostril twice daily if needed for runny nose. (Patient not taking: Reported on 10/08/2021) 30 mL 5   Multiple Vitamin (MULTI-VITAMINS) TABS Take by mouth.     Current Facility-Administered Medications  Medication Dose Route Frequency Provider Last Rate Last Admin   0.9 %  sodium chloride infusion  500 mL Intravenous Once Lilyrose Tanney, Carlota Raspberry, MD        Allergies as of 10/21/2021 - Review Complete  10/21/2021  Allergen Reaction Noted   Eye drops [naphazoline-polyethyl glycol] Swelling 05/03/2012    Family History  Problem Relation Age of Onset   Breast cancer Mother 22   Thyroid disease Mother    Obesity Mother    Diabetes Mother    Colon cancer Father 5   Diabetes Father    Heart disease Father    Hyperlipidemia Father    Hypertension Father    Sleep apnea Father    Alcoholism Father    Obesity Father    Migraines Brother    Breast cancer Maternal Aunt    Breast cancer Maternal Aunt    Breast cancer Maternal Grandmother 76   Diabetes Paternal Grandmother    Migraines Son    Esophageal cancer Neg Hx    Stomach cancer Neg Hx     Social History   Socioeconomic History   Marital status: Married    Spouse name: engaged   Number of children: 1   Years of education: college   Highest education level: Not on file  Occupational History   Occupation: CUSTOMER SERVICE    Employer: [employer]  Tobacco Use   Smoking status: Never   Smokeless tobacco: Never  Vaping Use   Vaping Use: Never  used  Substance and Sexual Activity   Alcohol use: Not Currently   Drug use: No   Sexual activity: Yes    Partners: Male    Birth control/protection: None  Other Topics Concern   Not on file  Social History Narrative   Lives with her son and her fiance.   Patient is left handed.   Patient drinks caffeine occasionally.   Social Determinants of Health   Financial Resource Strain: Not on file  Food Insecurity: Not on file  Transportation Needs: Not on file  Physical Activity: Not on file  Stress: Not on file  Social Connections: Not on file  Intimate Partner Violence: Not on file    Review of Systems: All other review of systems negative except as mentioned in the HPI.  Physical Exam: Vital signs BP (!) 124/58   Pulse 66   Temp 98.4 F (36.9 C)   Ht 5' 10.5" (1.791 m)   Wt (!) 325 lb (147.4 kg)   LMP 02/12/2021 (Approximate) Comment: Not sexually active   SpO2 97%   BMI 45.97 kg/m   General:   Alert,  Well-developed, pleasant and cooperative in NAD Lungs:  Clear throughout to auscultation.   Heart:  Regular rate and rhythm Abdomen:  Soft, nontender and nondistended. Protuberant abdomen.   Neuro/Psych:  Alert and cooperative. Normal mood and affect. A and O x 3  Jolly Mango, MD [facility]

## 2021-10-21 NOTE — Op Note (Signed)
[place] Patient Name: Lauren Daniels Procedure Date: 10/21/2021 3:49 PM MRN: [id number] Endoscopist: Remo Lipps P. Havery Moros , MD Age: 45 Referring MD:  Date of Birth: [birth year] Gender: Female Account #: [id number] Procedure:                Colonoscopy Indications:              Screening patient at increased risk: Family history                            of 1st-degree relative with colorectal cancer                            (father dx age 58) - first exam Medicines:                Monitored Anesthesia Care Procedure:                Pre-Anesthesia Assessment:                           - Prior to the procedure, a History and Physical                            was performed, and patient medications and                            allergies were reviewed. The patient's tolerance of                            previous anesthesia was also reviewed. The risks                            and benefits of the procedure and the sedation                            options and risks were discussed with the patient.                            All questions were answered, and informed consent                            was obtained. Prior Anticoagulants: The patient has                            taken no previous anticoagulant or antiplatelet                            agents. ASA Grade Assessment: III - A patient with                            severe systemic disease. After reviewing the risks                            and benefits, the patient was deemed in  satisfactory condition to undergo the procedure.                           After obtaining informed consent, the colonoscope                            was passed under direct vision. Throughout the                            procedure, the patient's blood pressure, pulse, and                            oxygen saturations were monitored continuously. The                            Olympus CF-HQ190L  [id number]) Colonoscope was                            introduced through the anus and advanced to the the                            cecum, identified by appendiceal orifice and                            ileocecal valve. The colonoscopy was performed                            without difficulty. The patient tolerated the                            procedure well. The quality of the bowel                            preparation was adequate. The ileocecal valve,                            appendiceal orifice, and rectum were photographed. Scope In: 3:57:59 PM Scope Out: 4:20:48 PM Scope Withdrawal Time: 0 hours 15 minutes 58 seconds  Total Procedure Duration: 0 hours 22 minutes 49 seconds  Findings:                 The perianal and digital rectal examinations were                            normal.                           A 3 mm polyp was found in the ascending colon. The                            polyp was sessile. The polyp was removed with a                            cold snare. Resection and retrieval were complete.  A few small-mouthed diverticula were found in the                            sigmoid colon.                           The exam was otherwise without abnormality. Complications:            No immediate complications. Estimated blood loss:                            Minimal. Estimated Blood Loss:     Estimated blood loss was minimal. Impression:               - One 3 mm polyp in the ascending colon, removed                            with a cold snare. Resected and retrieved.                           - Diverticulosis in the sigmoid colon.                           - The examination was otherwise normal. Recommendation:           - Patient has a contact number available for                            emergencies. The signs and symptoms of potential                            delayed complications were discussed with the                             patient. Return to normal activities tomorrow.                            Written discharge instructions were provided to the                            patient.                           - Resume previous diet.                           - Continue present medications.                           - Await pathology results. Remo Lipps P. Havery Moros, MD 10/21/2021 4:24:22 PM This report has been signed electronically.

## 2021-10-21 NOTE — Patient Instructions (Signed)
YOU HAD AN ENDOSCOPIC PROCEDURE TODAY AT THE [facility]:   Refer to the procedure report that was given to you for any specific questions about what was found during the examination.  If the procedure report does not answer your questions, please call your gastroenterologist to clarify.  If you requested that your care partner not be given the details of your procedure findings, then the procedure report has been included in a sealed envelope for you to review at your convenience later.  YOU SHOULD EXPECT: Some feelings of bloating in the abdomen. Passage of more gas than usual.  Walking can help get rid of the air that was put into your GI tract during the procedure and reduce the bloating. If you had a lower endoscopy (such as a colonoscopy or flexible sigmoidoscopy) you may notice spotting of blood in your stool or on the toilet paper. If you underwent a bowel prep for your procedure, you may not have a normal bowel movement for a few days.  Please Note:  You might notice some irritation and congestion in your nose or some drainage.  This is from the oxygen used during your procedure.  There is no need for concern and it should clear up in a day or so.  SYMPTOMS TO REPORT IMMEDIATELY:   Following lower endoscopy (colonoscopy or flexible sigmoidoscopy):  Excessive amounts of blood in the stool  Significant tenderness or worsening of abdominal pains  Swelling of the abdomen that is new, acute  Fever of 100F or higher  For urgent or emergent issues, a gastroenterologist can be reached at any hour by calling [phone]. Do not use MyChart messaging for urgent concerns.    DIET:  We do recommend a small meal at first, but then you may proceed to your regular diet.  Drink plenty of fluids but you should avoid alcoholic beverages for 24 hours.  ACTIVITY:  You should plan to take it easy for the rest of today and you should NOT DRIVE or use heavy machinery until tomorrow (because  of the sedation medicines used during the test).    FOLLOW UP: Our staff will call the number listed on your records 48-72 hours following your procedure to check on you and address any questions or concerns that you may have regarding the information given to you following your procedure. If we do not reach you, we will leave a message.  We will attempt to reach you two times.  During this call, we will ask if you have developed any symptoms of COVID 19. If you develop any symptoms (ie: fever, flu-like symptoms, shortness of breath, cough etc.) before then, please call [phone].  If you test positive for Covid 19 in the 2 weeks post procedure, please call and report this information to us.    If any biopsies were taken you will be contacted by phone or by letter within the next 1-3 weeks.  Please call us at [phone] if you have not heard about the biopsies in 3 weeks.    SIGNATURES/CONFIDENTIALITY: You and/or your care partner have signed paperwork which will be entered into your electronic medical record.  These signatures attest to the fact that that the information above on your After Visit Summary has been reviewed and is understood.  Full responsibility of the confidentiality of this discharge information lies with you and/or your care-partner. 

## 2021-10-21 NOTE — Progress Notes (Signed)
Sedate, gd SR, tolerated procedure well, VSS, report to RN 

## 2021-10-21 NOTE — Progress Notes (Signed)
Pt's states no medical or surgical changes since previsit or office visit.   VS taken by CW 

## 2021-10-21 NOTE — Progress Notes (Signed)
Sherwood Castilla CRNA relieves [employer]

## 2021-10-21 NOTE — Progress Notes (Signed)
Called to room to assist during endoscopic procedure.  Patient ID and intended procedure confirmed with present staff. Received instructions for my participation in the procedure from the performing physician.  

## 2021-10-23 ENCOUNTER — Telehealth: Payer: Self-pay | Admitting: *Deleted

## 2021-10-23 NOTE — Telephone Encounter (Signed)
Attempted to call patient for their post-procedure follow-up call. No answer. Unable to leave voicemail.  

## 2022-01-20 ENCOUNTER — Encounter (HOSPITAL_BASED_OUTPATIENT_CLINIC_OR_DEPARTMENT_OTHER): Payer: Self-pay | Admitting: Emergency Medicine

## 2022-01-20 ENCOUNTER — Emergency Department (HOSPITAL_BASED_OUTPATIENT_CLINIC_OR_DEPARTMENT_OTHER)
Admission: EM | Admit: 2022-01-20 | Discharge: 2022-01-20 | Disposition: A | Discharge status: Home / Self Care | Payer: BC Managed Care – PPO | Attending: Emergency Medicine | Admitting: Emergency Medicine

## 2022-01-20 ENCOUNTER — Other Ambulatory Visit: Payer: Self-pay

## 2022-01-20 DIAGNOSIS — Z20822 Contact with and (suspected) exposure to covid-19: Secondary | ICD-10-CM | POA: Diagnosis not present

## 2022-01-20 DIAGNOSIS — J069 Acute upper respiratory infection, unspecified: Secondary | ICD-10-CM

## 2022-01-20 DIAGNOSIS — R0981 Nasal congestion: Secondary | ICD-10-CM | POA: Diagnosis present

## 2022-01-20 LAB — RESP PANEL BY RT-PCR (FLU A&B, COVID) ARPGX2
Influenza A by PCR: NEGATIVE
Influenza B by PCR: NEGATIVE
SARS Coronavirus 2 by RT PCR: NEGATIVE

## 2022-01-20 LAB — PREGNANCY, URINE: Preg Test, Ur: NEGATIVE

## 2022-01-20 MED ORDER — DEXAMETHASONE 4 MG PO TABS
10.0000 mg | ORAL_TABLET | Freq: Once | ORAL | Status: AC
  Administered 2022-01-20: 10 mg via ORAL
  Filled 2022-01-20: qty 3

## 2022-01-20 MED ORDER — ACETAMINOPHEN 325 MG PO TABS
650.0000 mg | ORAL_TABLET | Freq: Once | ORAL | Status: AC
  Administered 2022-01-20: 650 mg via ORAL
  Filled 2022-01-20: qty 2

## 2022-01-20 MED ORDER — ONDANSETRON 4 MG PO TBDP: 4.0000 mg | ORAL_TABLET | Freq: Once | ORAL | Status: DC

## 2022-01-20 NOTE — ED Triage Notes (Signed)
Pt arrives to ED with c/o URI. Pt reports nasal drainage, sore throat, sneezing, congestion that started 3-4 days ago. This morning pt reports she experienced diarrhea and vomiting.

## 2022-01-20 NOTE — ED Notes (Signed)
Patient verbalizes understanding of discharge instructions. Opportunity for questioning and answers were provided. Patient discharged from ED.  °

## 2022-01-20 NOTE — ED Provider Notes (Signed)
[place] EMERGENCY DEPT Provider Note   CSN: [id number] Arrival date & time: 01/20/22  0865     History  Chief Complaint  Patient presents with   Nasal Congestion   Emesis    Lauren Daniels is a 46 y.o. female.  The history is provided by the patient.  URI Presenting symptoms: congestion   Severity:  Mild Onset quality:  Gradual Duration:  4 days Timing:  Intermittent Progression:  Waxing and waning Chronicity:  New Relieved by:  Nothing Worsened by:  Nothing Associated symptoms: myalgias   Associated symptoms: no arthralgias, no headaches, no neck pain, no sinus pain, no sneezing, no swollen glands and no wheezing   Risk factors: no sick contacts       Home Medications Prior to Admission medications   Medication Sig Start Date End Date Taking? Authorizing Provider  azelastine (ASTELIN) 0.1 % nasal spray Can use one to two sprays in each nostril twice daily if needed for runny nose. Patient not taking: Reported on 10/08/2021 12/29/18   Kennith Gain, MD  Multiple Vitamin (MULTI-VITAMINS) TABS Take by mouth.    [provider]      Allergies    Eye drops [naphazoline-polyethyl glycol]    Review of Systems   Review of Systems  HENT:  Positive for congestion. Negative for sinus pain and sneezing.   Respiratory:  Negative for wheezing.   Musculoskeletal:  Positive for myalgias. Negative for arthralgias and neck pain.  Neurological:  Negative for headaches.   Physical Exam Updated Vital Signs BP (!) 137/98 (BP Location: Right Arm)    Pulse 85    Temp 97.8 F (36.6 C)    Resp 16    Ht 5\' 10"  (1.778 m)    Wt (!) 152 kg    SpO2 100%    BMI 48.07 kg/m  Physical Exam Vitals and nursing note reviewed.  Constitutional:      General: She is not in acute distress.    Appearance: She is well-developed. She is not ill-appearing.  HENT:     Head: Normocephalic and atraumatic.     Nose: Congestion present.     Mouth/Throat:      Mouth: Mucous membranes are moist.  Eyes:     Extraocular Movements: Extraocular movements intact.     Conjunctiva/sclera: Conjunctivae normal.     Pupils: Pupils are equal, round, and reactive to light.  Cardiovascular:     Rate and Rhythm: Normal rate and regular rhythm.     Pulses: Normal pulses.     Heart sounds: Normal heart sounds. No murmur heard. Pulmonary:     Effort: Pulmonary effort is normal. No respiratory distress.     Breath sounds: Normal breath sounds.  Abdominal:     Palpations: Abdomen is soft.     Tenderness: There is no abdominal tenderness.  Musculoskeletal:        General: No swelling.     Cervical back: Neck supple.  Skin:    General: Skin is warm and dry.     Capillary Refill: Capillary refill takes less than 2 seconds.  Neurological:     General: No focal deficit present.     Mental Status: She is alert.  Psychiatric:        Mood and Affect: Mood normal.    ED Results / Procedures / Treatments   Labs (all labs ordered are listed, but only abnormal results are displayed) Labs Reviewed  RESP PANEL BY RT-PCR (FLU A&B, COVID) ARPGX2  PREGNANCY, URINE    EKG None  Radiology No results found.  Procedures Procedures    Medications Ordered in ED Medications  dexamethasone (DECADRON) tablet 10 mg (10 mg Oral Given 01/20/22 1121)  acetaminophen (TYLENOL) tablet 650 mg (650 mg Oral Given 01/20/22 1121)    ED Course/ Medical Decision Making/ A&P                           Medical Decision Making Amount and/or Complexity of Data Reviewed Labs: ordered.  Risk OTC drugs. Prescription drug management.   Lauren Daniels is here with nasal congestion, body aches.  Unremarkable vitals.  No fever.  Viral type symptoms last 3 to 4 days.  Has nasal congestion on exam.  No signs of pharyngitis on exam.  No major throat pain.  No swelling.  Clear breath sounds.  Feels like she is slightly dehydrated.  But overall patient appears well.  COVID and flu  test are negative.  Overall suspect viral process.  Given a dose of Decadron for symptomatic relief.  Given Tylenol as well.  Will prescribe Zofran to use as needed as she has had some emesis and diarrhea.  But no abdominal pain.  Does not look clinically dehydrated.  Overall discharged in good condition.  Understands return precautions.  This chart was dictated using voice recognition software.  Despite best efforts to proofread,  errors can occur which can change the documentation meaning.         Final Clinical Impression(s) / ED Diagnoses Final diagnoses:  Upper respiratory tract infection, unspecified type    Rx / DC Orders ED Discharge Orders     None         Lennice Sites, DO 01/20/22 1135

## 2022-11-25 ENCOUNTER — Ambulatory Visit (HOSPITAL_COMMUNITY)
Admission: EM | Admit: 2022-11-25 | Discharge: 2022-11-25 | Disposition: A | Discharge status: Home / Self Care | Payer: Medicaid Other | Attending: Emergency Medicine | Admitting: Emergency Medicine

## 2022-11-25 ENCOUNTER — Emergency Department (HOSPITAL_COMMUNITY): Payer: Medicaid Other | Admitting: Anesthesiology

## 2022-11-25 ENCOUNTER — Encounter (HOSPITAL_COMMUNITY)
Admission: EM | Disposition: A | Discharge status: Home / Self Care | Payer: Self-pay | Source: Home / Self Care | Attending: Emergency Medicine

## 2022-11-25 ENCOUNTER — Emergency Department (HOSPITAL_COMMUNITY): Payer: Medicaid Other

## 2022-11-25 ENCOUNTER — Emergency Department (EMERGENCY_DEPARTMENT_HOSPITAL): Payer: Medicaid Other | Admitting: Anesthesiology

## 2022-11-25 ENCOUNTER — Encounter (HOSPITAL_COMMUNITY): Payer: Self-pay

## 2022-11-25 ENCOUNTER — Other Ambulatory Visit: Payer: Self-pay

## 2022-11-25 DIAGNOSIS — K812 Acute cholecystitis with chronic cholecystitis: Secondary | ICD-10-CM | POA: Diagnosis not present

## 2022-11-25 DIAGNOSIS — Z803 Family history of malignant neoplasm of breast: Secondary | ICD-10-CM | POA: Diagnosis not present

## 2022-11-25 DIAGNOSIS — K8012 Calculus of gallbladder with acute and chronic cholecystitis without obstruction: Secondary | ICD-10-CM | POA: Insufficient documentation

## 2022-11-25 DIAGNOSIS — K8000 Calculus of gallbladder with acute cholecystitis without obstruction: Secondary | ICD-10-CM | POA: Insufficient documentation

## 2022-11-25 DIAGNOSIS — K449 Diaphragmatic hernia without obstruction or gangrene: Secondary | ICD-10-CM | POA: Insufficient documentation

## 2022-11-25 DIAGNOSIS — G4730 Sleep apnea, unspecified: Secondary | ICD-10-CM

## 2022-11-25 DIAGNOSIS — K8020 Calculus of gallbladder without cholecystitis without obstruction: Secondary | ICD-10-CM

## 2022-11-25 DIAGNOSIS — Z6841 Body Mass Index (BMI) 40.0 and over, adult: Secondary | ICD-10-CM

## 2022-11-25 HISTORY — PX: CHOLECYSTECTOMY: SHX55

## 2022-11-25 LAB — COMPREHENSIVE METABOLIC PANEL
ALT: 15 U/L (ref 0–44)
AST: 19 U/L (ref 15–41)
Albumin: 4.5 g/dL (ref 3.5–5.0)
Alkaline Phosphatase: 69 U/L (ref 38–126)
Anion gap: 9 (ref 5–15)
BUN: 11 mg/dL (ref 6–20)
CO2: 25 mmol/L (ref 22–32)
Calcium: 9.3 mg/dL (ref 8.9–10.3)
Chloride: 106 mmol/L (ref 98–111)
Creatinine, Ser: 0.77 mg/dL (ref 0.44–1.00)
GFR, Estimated: >60 mL/min (ref >60)
Glucose, Bld: 151 mg/dL — ABNORMAL HIGH (ref 70–99)
Potassium: 3.4 mmol/L — ABNORMAL LOW (ref 3.5–5.1)
Sodium: 140 mmol/L (ref 135–145)
Total Bilirubin: 0.5 mg/dL (ref 0.3–1.2)
Total Protein: 7.9 g/dL (ref 6.5–8.1)

## 2022-11-25 LAB — CBC
HCT: 39.9 % (ref 36.0–46.0)
Hemoglobin: 12.5 g/dL (ref 12.0–15.0)
MCH: 27.7 pg (ref 26.0–34.0)
MCHC: 31.3 g/dL (ref 30.0–36.0)
MCV: 88.3 fL (ref 80.0–100.0)
Platelets: 384 10*3/uL (ref 150–400)
RBC: 4.52 MIL/uL (ref 3.87–5.11)
RDW: 13.9 % (ref 11.5–15.5)
WBC: 7 10*3/uL (ref 4.0–10.5)
nRBC: 0 % (ref 0.0–0.2)

## 2022-11-25 LAB — I-STAT BETA HCG BLOOD, ED (MC, WL, AP ONLY): I-stat hCG, quantitative: <5 m[IU]/mL (ref <5)

## 2022-11-25 LAB — URINALYSIS, ROUTINE W REFLEX MICROSCOPIC
Bilirubin Urine: NEGATIVE
Glucose, UA: NEGATIVE mg/dL
Hgb urine dipstick: NEGATIVE
Ketones, ur: NEGATIVE mg/dL
Leukocytes,Ua: NEGATIVE
Nitrite: NEGATIVE
Protein, ur: NEGATIVE mg/dL
Specific Gravity, Urine: 1.015 (ref 1.005–1.030)
pH: 7.5 (ref 5.0–8.0)

## 2022-11-25 LAB — LIPASE, BLOOD: Lipase: 47 U/L (ref 11–51)

## 2022-11-25 SURGERY — LAPAROSCOPIC CHOLECYSTECTOMY WITH INTRAOPERATIVE CHOLANGIOGRAM
Anesthesia: General | Site: Abdomen

## 2022-11-25 MED ORDER — ONDANSETRON HCL 4 MG/2ML IJ SOLN
INTRAMUSCULAR | Status: AC
  Filled 2022-11-25: qty 2

## 2022-11-25 MED ORDER — ONDANSETRON 4 MG PO TBDP
4.0000 mg | ORAL_TABLET | Freq: Once | ORAL | Status: AC | PRN
Start: 2022-11-25 — End: 2022-11-25
  Administered 2022-11-25: 4 mg via ORAL
  Filled 2022-11-25: qty 1

## 2022-11-25 MED ORDER — LACTATED RINGERS IV SOLN: INTRAVENOUS | Status: DC

## 2022-11-25 MED ORDER — KETOROLAC TROMETHAMINE 15 MG/ML IJ SOLN: 15.0000 mg | Freq: Once | INTRAMUSCULAR | Status: AC

## 2022-11-25 MED ORDER — OXYCODONE HCL 5 MG PO TABS: 5.0000 mg | ORAL_TABLET | Freq: Once | ORAL | Status: AC

## 2022-11-25 MED ORDER — MIDAZOLAM HCL 2 MG/2ML IJ SOLN: 1.0000 mg | Freq: Once | INTRAMUSCULAR | Status: DC

## 2022-11-25 MED ORDER — FENTANYL CITRATE PF 50 MCG/ML IJ SOSY: 50.0000 ug | PREFILLED_SYRINGE | INTRAMUSCULAR | Status: DC | PRN

## 2022-11-25 MED ORDER — BUPIVACAINE-EPINEPHRINE 0.5% -1:200000 IJ SOLN
INTRAMUSCULAR | Status: DC | PRN
  Administered 2022-11-25: 30 mL

## 2022-11-25 MED ORDER — FENTANYL CITRATE PF 50 MCG/ML IJ SOSY
PREFILLED_SYRINGE | INTRAMUSCULAR | Status: AC
  Administered 2022-11-25: 50 ug via INTRAVENOUS
  Filled 2022-11-25: qty 1

## 2022-11-25 MED ORDER — LIDOCAINE 2% (20 MG/ML) 5 ML SYRINGE
INTRAMUSCULAR | Status: DC | PRN
  Administered 2022-11-25: 100 mg via INTRAVENOUS

## 2022-11-25 MED ORDER — OXYCODONE HCL 5 MG PO TABS
5.0000 mg | ORAL_TABLET | ORAL | Status: DC | PRN
  Administered 2022-11-25: 10 mg via ORAL

## 2022-11-25 MED ORDER — PHENYLEPHRINE 80 MCG/ML (10ML) SYRINGE FOR IV PUSH (FOR BLOOD PRESSURE SUPPORT)
PREFILLED_SYRINGE | INTRAVENOUS | Status: DC | PRN
  Administered 2022-11-25 (×2): 240 ug via INTRAVENOUS

## 2022-11-25 MED ORDER — MIDAZOLAM HCL 2 MG/2ML IJ SOLN
INTRAMUSCULAR | Status: AC
  Administered 2022-11-25: 1 mg via INTRAVENOUS
  Filled 2022-11-25: qty 2

## 2022-11-25 MED ORDER — FENTANYL CITRATE PF 50 MCG/ML IJ SOSY: 25.0000 ug | PREFILLED_SYRINGE | INTRAMUSCULAR | Status: DC | PRN

## 2022-11-25 MED ORDER — OXYCODONE-ACETAMINOPHEN 5-325 MG PO TABS
2.0000 | ORAL_TABLET | Freq: Once | ORAL | Status: AC
  Administered 2022-11-25: 2 via ORAL
  Filled 2022-11-25: qty 2

## 2022-11-25 MED ORDER — METHOCARBAMOL 500 MG PO TABS: 750.0000 mg | ORAL_TABLET | Freq: Three times a day (TID) | ORAL | Status: DC | PRN

## 2022-11-25 MED ORDER — SCOPOLAMINE 1 MG/3DAYS TD PT72
1.0000 | MEDICATED_PATCH | TRANSDERMAL | Status: DC
  Administered 2022-11-25: 1.5 mg via TRANSDERMAL
  Filled 2022-11-25: qty 1

## 2022-11-25 MED ORDER — ACETAMINOPHEN 650 MG RE SUPP: 650.0000 mg | RECTAL | Status: DC | PRN

## 2022-11-25 MED ORDER — OXYCODONE HCL 5 MG PO TABS
ORAL_TABLET | ORAL | Status: AC
  Filled 2022-11-25: qty 1

## 2022-11-25 MED ORDER — ACETAMINOPHEN 325 MG PO TABS
ORAL_TABLET | ORAL | Status: AC
  Filled 2022-11-25: qty 2

## 2022-11-25 MED ORDER — OXYCODONE HCL 5 MG PO TABS
ORAL_TABLET | ORAL | Status: AC
  Administered 2022-11-25: 10 mg via ORAL
  Filled 2022-11-25: qty 2

## 2022-11-25 MED ORDER — ACETAMINOPHEN 325 MG PO TABS
650.0000 mg | ORAL_TABLET | ORAL | Status: DC | PRN
  Administered 2022-11-25: 650 mg via ORAL

## 2022-11-25 MED ORDER — LACTATED RINGERS IR SOLN
Status: DC | PRN
  Administered 2022-11-25: 1000 mL

## 2022-11-25 MED ORDER — DEXAMETHASONE SODIUM PHOSPHATE 10 MG/ML IJ SOLN
INTRAMUSCULAR | Status: DC | PRN
  Administered 2022-11-25: 10 mg via INTRAVENOUS

## 2022-11-25 MED ORDER — SUGAMMADEX SODIUM 500 MG/5ML IV SOLN
INTRAVENOUS | Status: AC
  Filled 2022-11-25: qty 5

## 2022-11-25 MED ORDER — SUGAMMADEX SODIUM 200 MG/2ML IV SOLN
INTRAVENOUS | Status: DC | PRN
  Administered 2022-11-25: 300 mg via INTRAVENOUS

## 2022-11-25 MED ORDER — KETOROLAC TROMETHAMINE 30 MG/ML IJ SOLN: INTRAMUSCULAR | Status: DC | PRN

## 2022-11-25 MED ORDER — FENTANYL CITRATE (PF) 250 MCG/5ML IJ SOLN
INTRAMUSCULAR | Status: AC
  Filled 2022-11-25: qty 5

## 2022-11-25 MED ORDER — ROCURONIUM BROMIDE 10 MG/ML (PF) SYRINGE
PREFILLED_SYRINGE | INTRAVENOUS | Status: DC | PRN
  Administered 2022-11-25: 70 mg via INTRAVENOUS

## 2022-11-25 MED ORDER — ONDANSETRON HCL 4 MG/2ML IJ SOLN
INTRAMUSCULAR | Status: DC | PRN
  Administered 2022-11-25: 4 mg via INTRAVENOUS

## 2022-11-25 MED ORDER — METHOCARBAMOL 500 MG PO TABS
ORAL_TABLET | ORAL | Status: AC
  Administered 2022-11-25: 750 mg via ORAL
  Filled 2022-11-25: qty 2

## 2022-11-25 MED ORDER — PROPOFOL 10 MG/ML IV BOLUS
INTRAVENOUS | Status: AC
  Filled 2022-11-25: qty 20

## 2022-11-25 MED ORDER — BUPIVACAINE LIPOSOME 1.3 % IJ SUSP
INTRAMUSCULAR | Status: DC | PRN
  Administered 2022-11-25: 20 mL

## 2022-11-25 MED ORDER — BUPIVACAINE LIPOSOME 1.3 % IJ SUSP
INTRAMUSCULAR | Status: AC
  Filled 2022-11-25: qty 20

## 2022-11-25 MED ORDER — KETOROLAC TROMETHAMINE 15 MG/ML IJ SOLN
INTRAMUSCULAR | Status: AC
  Administered 2022-11-25: 15 mg via INTRAVENOUS
  Filled 2022-11-25: qty 1

## 2022-11-25 MED ORDER — MIDAZOLAM HCL 2 MG/2ML IJ SOLN: 1.0000 mg | Freq: Once | INTRAMUSCULAR | Status: AC

## 2022-11-25 MED ORDER — CEFAZOLIN IN SODIUM CHLORIDE 3-0.9 GM/100ML-% IV SOLN
3.0000 g | INTRAVENOUS | Status: DC
Start: 2022-11-25 — End: 2022-11-25
  Filled 2022-11-25: qty 100

## 2022-11-25 MED ORDER — ONDANSETRON HCL 4 MG/2ML IJ SOLN
4.0000 mg | Freq: Once | INTRAMUSCULAR | Status: AC
  Administered 2022-11-25: 4 mg via INTRAVENOUS
  Filled 2022-11-25: qty 2

## 2022-11-25 MED ORDER — PROMETHAZINE HCL 25 MG/ML IJ SOLN: 6.2500 mg | INTRAMUSCULAR | Status: DC | PRN

## 2022-11-25 MED ORDER — LACTATED RINGERS IV BOLUS
1000.0000 mL | Freq: Once | INTRAVENOUS | Status: AC
  Administered 2022-11-25: 1000 mL via INTRAVENOUS

## 2022-11-25 MED ORDER — FENTANYL CITRATE (PF) 100 MCG/2ML IJ SOLN
INTRAMUSCULAR | Status: DC | PRN
  Administered 2022-11-25: 100 ug via INTRAVENOUS
  Administered 2022-11-25 (×2): 50 ug via INTRAVENOUS

## 2022-11-25 MED ORDER — CEFAZOLIN IN SODIUM CHLORIDE 3-0.9 GM/100ML-% IV SOLN
INTRAVENOUS | Status: AC
  Filled 2022-11-25: qty 100

## 2022-11-25 MED ORDER — FENTANYL CITRATE PF 50 MCG/ML IJ SOSY
25.0000 ug | PREFILLED_SYRINGE | INTRAMUSCULAR | Status: DC | PRN
  Administered 2022-11-25: 50 ug via INTRAVENOUS

## 2022-11-25 MED ORDER — SODIUM CHLORIDE 0.9% FLUSH: 3.0000 mL | Freq: Two times a day (BID) | INTRAVENOUS | Status: DC

## 2022-11-25 MED ORDER — 0.9 % SODIUM CHLORIDE (POUR BTL) OPTIME
TOPICAL | Status: DC | PRN
  Administered 2022-11-25: 1000 mL

## 2022-11-25 MED ORDER — SODIUM CHLORIDE 0.9% FLUSH: 3.0000 mL | INTRAVENOUS | Status: DC | PRN

## 2022-11-25 MED ORDER — SUCCINYLCHOLINE CHLORIDE 200 MG/10ML IV SOSY
PREFILLED_SYRINGE | INTRAVENOUS | Status: DC | PRN
  Administered 2022-11-25: 100 mg via INTRAVENOUS

## 2022-11-25 MED ORDER — SODIUM CHLORIDE 0.9 % IV SOLN: 250.0000 mL | INTRAVENOUS | Status: DC | PRN

## 2022-11-25 MED ORDER — HYDROMORPHONE HCL 1 MG/ML IJ SOLN
1.0000 mg | Freq: Once | INTRAMUSCULAR | Status: AC
  Administered 2022-11-25: 1 mg via INTRAVENOUS
  Filled 2022-11-25: qty 1

## 2022-11-25 MED ORDER — DOCUSATE SODIUM 100 MG PO CAPS: 100.0000 mg | ORAL_CAPSULE | Freq: Two times a day (BID) | ORAL | 0 refills | Status: AC

## 2022-11-25 MED ORDER — KETOROLAC TROMETHAMINE 30 MG/ML IJ SOLN
INTRAMUSCULAR | Status: DC | PRN
  Administered 2022-11-25: 30 mg via INTRAVENOUS

## 2022-11-25 MED ORDER — PROPOFOL 10 MG/ML IV BOLUS
INTRAVENOUS | Status: DC | PRN
  Administered 2022-11-25: 200 mg via INTRAVENOUS

## 2022-11-25 MED ORDER — MIDAZOLAM HCL 2 MG/2ML IJ SOLN
INTRAMUSCULAR | Status: AC
  Filled 2022-11-25: qty 2

## 2022-11-25 MED ORDER — MIDAZOLAM HCL 5 MG/5ML IJ SOLN
INTRAMUSCULAR | Status: DC | PRN
  Administered 2022-11-25: 2 mg via INTRAVENOUS

## 2022-11-25 MED ORDER — TRAMADOL HCL 50 MG PO TABS: 50.0000 mg | ORAL_TABLET | Freq: Four times a day (QID) | ORAL | 0 refills | Status: AC | PRN

## 2022-11-25 MED ORDER — BUPIVACAINE-EPINEPHRINE (PF) 0.5% -1:200000 IJ SOLN
INTRAMUSCULAR | Status: AC
  Filled 2022-11-25: qty 30

## 2022-11-25 MED ORDER — FENTANYL CITRATE PF 50 MCG/ML IJ SOSY
PREFILLED_SYRINGE | INTRAMUSCULAR | Status: AC
  Administered 2022-11-25: 50 ug via INTRAVENOUS
  Filled 2022-11-25: qty 2

## 2022-11-25 SURGICAL SUPPLY — 43 items
APPLIER CLIP ROT 10 11.4 M/L (STAPLE) ×1
BAG COUNTER SPONGE SURGICOUNT (BAG) ×1 IMPLANT
BENZOIN TINCTURE PRP APPL 2/3 (GAUZE/BANDAGES/DRESSINGS) IMPLANT
BNDG ADH 1X3 SHEER STRL LF (GAUZE/BANDAGES/DRESSINGS) IMPLANT
CABLE HIGH FREQUENCY MONO STRZ (ELECTRODE) ×1 IMPLANT
CHLORAPREP W/TINT 26 (MISCELLANEOUS) ×1 IMPLANT
CLIP APPLIE ROT 10 11.4 M/L (STAPLE) ×1 IMPLANT
COVER MAYO STAND XLG (MISCELLANEOUS) IMPLANT
COVER SURGICAL LIGHT HANDLE (MISCELLANEOUS) ×1 IMPLANT
DERMABOND ADVANCED .7 DNX12 (GAUZE/BANDAGES/DRESSINGS) IMPLANT
DRAPE C-ARM 42X120 X-RAY (DRAPES) IMPLANT
ELECT REM PT RETURN 15FT ADLT (MISCELLANEOUS) ×1 IMPLANT
ENDOLOOP SUT PDS II  0 18 (SUTURE) ×3
ENDOLOOP SUT PDS II 0 18 (SUTURE) IMPLANT
GLOVE BIO SURGEON STRL SZ 6 (GLOVE) ×1 IMPLANT
GLOVE INDICATOR 6.5 STRL GRN (GLOVE) ×1 IMPLANT
GLOVE SS BIOGEL STRL SZ 6 (GLOVE) ×1 IMPLANT
GOWN STRL REUS W/ TWL LRG LVL3 (GOWN DISPOSABLE) ×1 IMPLANT
GOWN STRL REUS W/ TWL XL LVL3 (GOWN DISPOSABLE) IMPLANT
GOWN STRL REUS W/TWL LRG LVL3 (GOWN DISPOSABLE) ×1
GOWN STRL REUS W/TWL XL LVL3 (GOWN DISPOSABLE)
GRASPER SUT TROCAR 14GX15 (MISCELLANEOUS) IMPLANT
HEMOSTAT SNOW SURGICEL 2X4 (HEMOSTASIS) IMPLANT
IRRIG SUCT STRYKERFLOW 2 WTIP (MISCELLANEOUS) ×1
IRRIGATION SUCT STRKRFLW 2 WTP (MISCELLANEOUS) ×1 IMPLANT
KIT BASIN OR (CUSTOM PROCEDURE TRAY) ×1 IMPLANT
KIT TURNOVER KIT A (KITS) IMPLANT
NDL INSUFFLATION 14GA 120MM (NEEDLE) ×1 IMPLANT
NEEDLE INSUFFLATION 14GA 120MM (NEEDLE) ×1 IMPLANT
SCISSORS LAP 5X35 DISP (ENDOMECHANICALS) ×1 IMPLANT
SET CHOLANGIOGRAPH MIX (MISCELLANEOUS) IMPLANT
SET TUBE SMOKE EVAC HIGH FLOW (TUBING) ×1 IMPLANT
SLEEVE Z-THREAD 5X100MM (TROCAR) ×2 IMPLANT
SPIKE FLUID TRANSFER (MISCELLANEOUS) ×1 IMPLANT
STRIP CLOSURE SKIN 1/2X4 (GAUZE/BANDAGES/DRESSINGS) IMPLANT
SUT MNCRL AB 4-0 PS2 18 (SUTURE) ×1 IMPLANT
SYS BAG RETRIEVAL 10MM (BASKET) ×1
SYSTEM BAG RETRIEVAL 10MM (BASKET) ×1 IMPLANT
TOWEL OR 17X26 10 PK STRL BLUE (TOWEL DISPOSABLE) ×1 IMPLANT
TOWEL OR NON WOVEN STRL DISP B (DISPOSABLE) IMPLANT
TRAY LAPAROSCOPIC (CUSTOM PROCEDURE TRAY) ×1 IMPLANT
TROCAR ADV FIXATION 12X100MM (TROCAR) ×1 IMPLANT
TROCAR Z-THREAD OPTICAL 5X100M (TROCAR) ×1 IMPLANT

## 2022-11-25 NOTE — Anesthesia Postprocedure Evaluation (Signed)
Anesthesia Post Note  Patient: [name]  Procedure(s) Performed: LAPAROSCOPIC CHOLECYSTECTOMY (Abdomen)     Patient location during evaluation: PACU Anesthesia Type: General Level of consciousness: sedated Pain management: pain level controlled Vital Signs Assessment: post-procedure vital signs reviewed and stable Respiratory status: spontaneous breathing and respiratory function stable Cardiovascular status: stable Postop Assessment: no apparent nausea or vomiting Anesthetic complications: no   No notable events documented.  Last Vitals:  Vitals:   11/25/22 1145 11/25/22 1200  BP: 125/82 128/84  Pulse: 64 60  Resp: 16 16  Temp:    SpO2: 92% 94%    Last Pain:  Vitals:   11/25/22 1145  TempSrc:   PainSc: 6                  Dillian Feig DANIEL

## 2022-11-25 NOTE — ED Notes (Signed)
US at bedside

## 2022-11-25 NOTE — ED Provider Notes (Signed)
[facility] Emergency Department Provider Note MRN:  [id number]  Arrival date & time: 11/25/22     Chief Complaint   Abdominal Pain   History of Present Illness   Magnolia Caputo is a 46 y.o. year-old female presents to the ED with chief complaint of RUQ abdominal pain that started a few hour ago.  Experienced similar once a few days ago, but not this bad.  Denies fevers or chills.  Has had persistent vomiting and severe pain all day.  Pain radiates to the back.  History provided by patient.   Review of Systems  Pertinent positive and negative review of systems noted in HPI.    Physical Exam   Vitals:   11/25/22 0019  BP: (!) 123/101  Pulse: 99  Resp: 20  Temp: 98.6 F (37 C)  SpO2: 95%    CONSTITUTIONAL:  uncomfortable-appearing, NAD NEURO:  Alert and oriented x 3, CN 3-12 grossly intact EYES:  eyes equal and reactive ENT/NECK:  Supple, no stridor  CARDIO:  tachycardic, regular rhythm, appears well-perfused  PULM:  No respiratory distress, CTAB GI/GU:  non-distended, RUQ TTP MSK/SPINE:  No gross deformities, no edema, moves all extremities  SKIN:  no rash, atraumatic   *Additional and/or pertinent findings included in MDM below  Diagnostic and Interventional Summary    EKG Interpretation  Date/Time:    Ventricular Rate:    PR Interval:    QRS Duration:   QT Interval:    QTC Calculation:   R Axis:     Text Interpretation:         Labs Reviewed  COMPREHENSIVE METABOLIC PANEL - Abnormal; Notable for the following components:      Result Value   Potassium 3.4 (*)    Glucose, Bld 151 (*)    All other components within normal limits  LIPASE, BLOOD  CBC  URINALYSIS, ROUTINE W REFLEX MICROSCOPIC  I-STAT BETA HCG BLOOD, ED ([facility], [facility], [facility] ONLY)    US Abdomen Limited  Final Result      Medications  oxyCODONE-acetaminophen (PERCOCET/ROXICET) 5-325 MG per tablet 2 tablet (has no administration in time range)  ondansetron  (ZOFRAN-ODT) disintegrating tablet 4 mg (4 mg Oral Given 11/25/22 0049)  HYDROmorphone (DILAUDID) injection 1 mg (1 mg Intravenous Given 11/25/22 0133)  lactated ringers bolus 1,000 mL (1,000 mLs Intravenous New Bag/Given 11/25/22 0134)  ondansetron (ZOFRAN) injection 4 mg (4 mg Intravenous Given 11/25/22 0245)     Procedures  /  Critical Care Procedures  ED Course and Medical Decision Making  I have reviewed the triage vital signs, the nursing notes, and pertinent available records from the EMR.  Social Determinants Affecting Complexity of Care: Patient has no clinically significant social determinants affecting this chief complaint..   ED Course:    Medical Decision Making Patient here with RUQ abdominal pain.  Sudden onset this evening.  Has had persistent vomiting.  Denies fevers.  Onset after eating.  Concerning for gallbladder disease.  Will check [place] and labs and treat pain.  [place] positive for gallstones.   Still having significant pain.  Will consult with general surgery due to intractable pain.  Problems Addressed: Symptomatic cholelithiasis: acute illness or injury  Amount and/or Complexity of Data Reviewed Labs: ordered.    Details: Normal LFTs Normal WBC K 3.4 Radiology: ordered and independent interpretation performed.    Details: cholelithiasis  Risk Prescription drug management. Decision regarding hospitalization.     Consultants: I discussed the case with Dr. Kae Heller, from gen.  surg., who agrees to come evaluate the patient in the morning.   Treatment and Plan: Patient's exam and diagnostic results are concerning for symptomatic cholelithiasis.  Feel that patient will need admission to the hospital for further treatment and evaluation.  Patient discussed with attending physician, Dr. Ralene Bathe, who agrees with plan.  Final Clinical Impressions(s) / ED Diagnoses     ICD-10-CM   1. Symptomatic cholelithiasis  K80.20       ED Discharge Orders     None          Discharge Instructions Discussed with and Provided to Patient:   Discharge Instructions   None      Montine Circle, PA-C 11/25/22 1856    Quintella Reichert, MD 11/25/22 2333

## 2022-11-25 NOTE — Op Note (Signed)
Operative Note  Lauren Daniels 46 y.o. 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 Antibiotics were administered. SCD's were applied. General endotracheal anesthesia was initiated and a formal time-out was performed. The abdomen was prepped and draped in the usual sterile fashion and the abdomen was entered using  a left subcostal Veress needle  after instilling the site with local. Insufflation to 34mHg was obtained, 571mtrocar and camera inserted using optical entry in the right upper quadrant, and gross inspection revealed no evidence of injury from our entry or other intraabdominal abnormalities. Two 56m60mrocars were introduced in the infraumbilical and right anterior axillary lines under direct visualization and following infiltration with local. A 51m72mocar was placed in the epigastrium.  The gallbladder was tensely distended, edematous and discolored consistent with acute cholecystitis.  In addition there was thick omental adhesion and fat infiltration along the infundibulum and hepatocystic triangle consistent with chronic cholecystitis as well.  The gallbladder was decompressed within the shot so that the fundus could be grasped.  The fundus was retracted cephalad and omental adhesions to the gallbladder body and infundibulum were carefully divided with cautery and blunt dissection until the infundibulum could be identified and grasped.  This was retracted laterally.  A combination of hook electrocautery and blunt dissection  was utilized to clear the peritoneum from the neck and cystic duct, circumferentially isolating the cystic artery and cystic duct and lifting the gallbladder from the cystic plate.  The cystic artery was diminutive and was divided with cautery.  Although we were quite high up on the gallbladder, what appeared to be the cystic duct was extremely dilated but dove into an area of thick fat infiltration and cicatrix along the hepatocystic triangle.  We carefully dissected the gallbladder from the liver until the only structure connecting the gallbladder to the patient was this apparently dilated gallbladder neck/cystic duct.  This was ligated with two PDS Endoloops and then divided sharply.  The specimen was removed and handed off for pathology.  The remaining structure remained quite dilated.  We very carefully continued dissecting medially, dividing thin transparent strands of peritoneum and soft tissue, following the cystic duct medially.  Eventually it did taper down to a more normal-appearing caliber.  There were no other tubular structures entering this area, confirming that this in fact was the cystic duct.  3 clips were placed across this as well as an additional Endoloop just proximal to the clips and the gallbladder neck was sharply divided from the ligated cystic duct and added to the specimen. Some bile had been spilled from the gallbladder during its dissection from the liver bed. This was aspirated and the right upper quadrant was irrigated copiously until the effluent was clear. Hemostasis was once again confirmed, and reinspection of the abdomen revealed no injuries. The clips were well opposed without any bile leak from the cystic duct or the liver bed.  The porta and area of the common bile duct were not dissected out or exposed, and seem to be well medial to where we had divided the cystic duct. The 51mm11mcar site in the epigastrium was closed with  a 0 vicryl in the fascia under direct  visualization using a PMI device. The abdomen was desufflated and all trocars removed. The skin incisions were closed with subcuticular monocryl and Dermabond. The patient was awakened, extubated and transported to the recovery room in stable condition.    All counts were correct at the completion of the case.

## 2022-11-25 NOTE — Discharge Instructions (Signed)
LAPAROSCOPIC SURGERY: POST OP INSTRUCTIONS   EAT Gradually transition to a high fiber diet with a fiber supplement over the next few weeks after discharge.  Start with a pureed / full liquid diet (see below)  WALK Walk an hour a day (cumulative- not all at once).  Control your pain to do that.    CONTROL PAIN Control pain so that you can walk, sleep, tolerate sneezing/coughing, go up/down stairs.  HAVE A BOWEL MOVEMENT DAILY Keep your bowels regular to avoid problems.  OK to try a laxative to override constipation.  OK to use an antidiarrheal to slow down diarrhea.  Call if not better after 2 tries  CALL IF YOU HAVE PROBLEMS/CONCERNS Call if you are still struggling despite following these instructions. Call if you have concerns not answered by these instructions    DIET: Follow a light bland diet & liquids the first 24 hours after arrival home, such as soup, liquids, starches, etc.  Be sure to drink plenty of fluids.  Quickly advance to a usual solid diet within a few days.  Avoid fast food or heavy meals initially as you are more likely to get nauseated or have irregular bowels.  A low-sugar, high-fiber diet for the rest of your life is ideal.  Take your usually prescribed home medications unless otherwise directed.  PAIN CONTROL: Pain is best controlled by a usual combination of three different methods TOGETHER: Ice/Heat Over the counter pain medication Prescription pain medication Most patients will experience some swelling and bruising around the incisions.  Ice packs or heating pads (30-60 minutes up to 6 times a day) will help. Use ice for the first few days to help decrease swelling and bruising, then switch to heat to help relax tight/sore spots and speed recovery.  Some people prefer to use ice alone, heat alone, alternating between ice & heat.  Experiment to what works for you.  Swelling and bruising can take several weeks to resolve.   It is helpful to take an  over-the-counter pain medication regularly for the first few days: Naproxen (Aleve, etc)  Two 220m tabs twice a day OR Ibuprofen (Advil, etc) Three 2090mtabs four times a day (every meal & bedtime) AND Acetaminophen (Tylenol, etc) 500-65074mour times a day (every meal & bedtime) A  prescription for pain medication (such as oxycodone, hydrocodone, tramadol, gabapentin, methocarbamol, etc) should be given to you upon discharge.  Take your pain medication as prescribed, IF NEEDED.  If you are having problems/concerns with the prescription medicine (does not control pain, nausea, vomiting, rash, itching, etc), please call us [phone] see if we need to switch you to a different pain medicine that will work better for you and/or control your side effect better. If you need a refill on your pain medication, please give us [place] hour notice.  contact your pharmacy.  They will contact our office to request authorization. Prescriptions will not be filled after 5 pm or on week-ends  Avoid getting constipated.   Between the surgery and the pain medications, it is common to experience some constipation.   Increasing fluid intake and taking a fiber supplement (such as Metamucil, Citrucel, FiberCon, MiraLax, etc) 1-2 times a day regularly will usually help prevent this problem from occurring.   A mild laxative (prune juice, Milk of Magnesia, MiraLax, etc) should be taken according to package directions if there are no bowel movements after 48 hours.   Watch out for diarrhea.   If you have many  loose bowel movements, simplify your diet to bland foods & liquids for a few days.   Stop any stool softeners and decrease your fiber supplement.   Switching to mild anti-diarrheal medications (Kayopectate, Pepto Bismol) can help.   If this worsens or does not improve, please call us.  Wash / shower every day.  You may shower over the skin glue which is waterproof.  Do not soak or submerge incisions.  No rubbing,  scrubbing, lotions or ointments to incisions.  Glue will flake off after about 2 weeks.  You may leave the incision open to air.  You may replace a dressing/Band-Aid to cover the incision for comfort if you wish.   ACTIVITIES as tolerated:   You may resume regular (light) daily activities beginning the next day--such as daily self-care, walking, climbing stairs--gradually increasing activities as tolerated.  If you can walk 30 minutes without difficulty, it is safe to try more intense activity such as jogging, treadmill, bicycling, low-impact aerobics, swimming, etc. Save the most intensive and strenuous activity for last such as sit-ups, heavy lifting, contact sports, etc  Refrain from any heavy lifting or straining until you are off narcotics for pain control.   DO NOT PUSH THROUGH PAIN.  Let pain be your guide: If it hurts to do something, don't do it.  Pain is your body warning you to avoid that activity for another week until the pain goes down. You may drive when you are no longer taking prescription pain medication, you can comfortably wear a seatbelt, and you can safely maneuver your car and apply brakes. You may have sexual intercourse when it is comfortable.  FOLLOW UP in our office Please call [facility] at [phone] to set up an appointment to see your surgeon in the office for a follow-up appointment approximately 2-3 weeks after your surgery. Make sure that you call for this appointment the day you arrive home to insure a convenient appointment time.  10. IF YOU HAVE DISABILITY OR FAMILY LEAVE FORMS, BRING THEM TO THE OFFICE FOR PROCESSING.  DO NOT GIVE THEM TO YOUR DOCTOR.   WHEN TO CALL us [phone]: Poor pain control Reactions / problems with new medications (rash/itching, nausea, etc)  Fever over 101.5 F (38.5 C) Inability to urinate Nausea and/or vomiting Worsening swelling or bruising Continued bleeding from incision. Increased pain, redness, or drainage from the  incision   The clinic staff is available to answer your questions during regular business hours (8:30am-5pm).  Please don't hesitate to call and ask to speak to one of our nurses for clinical concerns.   If you have a medical emergency, go to the nearest emergency room or call 911.  A surgeon from [facility] is always on call at the [facility], [place], [place], [place], [place]  [id number] ? MAIN: [phone] ? TOLL FREE: [phone] ?  FAX [phone] [email or web address]

## 2022-11-25 NOTE — Transfer of Care (Signed)
Immediate Anesthesia Transfer of Care Note  Patient: Lauren Daniels  Procedure(s) Performed: LAPAROSCOPIC CHOLECYSTECTOMY (Abdomen)  Patient Location: PACU  Anesthesia Type:General  Level of Consciousness: awake, alert , and oriented  Airway & Oxygen Therapy: Patient Spontanous Breathing and Patient connected to face mask oxygen  Post-op Assessment: Report given to RN and Post -op Vital signs reviewed and stable  Post vital signs: Reviewed and stable  Last Vitals:  Vitals Value Taken Time  BP 128/84 11/25/22 0949  Temp    Pulse 71 11/25/22 0950  Resp    SpO2 100 % 11/25/22 0950  Vitals shown include unvalidated device data.  Last Pain:  Vitals:   11/25/22 0743  TempSrc:   PainSc: 5          Complications: No notable events documented.

## 2022-11-25 NOTE — ED Triage Notes (Signed)
Patient arrived with complaints of RUQ pain and NV over the last 5 hours.

## 2022-11-25 NOTE — ED Provider Triage Note (Signed)
  Emergency Medicine Provider Triage Evaluation Note  MRN:  [id number]  Arrival date & time: 11/25/22    Medically screening exam initiated at 12:47 AM.   CC:   Abdominal Pain   HPI:  Lauren Daniels is a 46 y.o. year-old female presents to the ED with chief complaint of RUQ abdominal pain for the past few hours with associated vomiting.  No hx of the same.  Pain is severe.  Radiates to her back.  History provided by patient. ROS:  -As included in HPI PE:   Vitals:   11/25/22 0019  BP: (!) 123/101  Pulse: 99  Resp: 20  Temp: 98.6 F (37 C)  SpO2: 95%    Non-toxic appearing No respiratory distress RUQ abdominal pain MDM:  Based on signs and symptoms, gallbladder disease is highest on my differential, followed by pancreatitis. I've ordered labs and imaging in triage to expedite lab/diagnostic workup.  Patient was informed that the remainder of the evaluation will be completed by another provider, this initial triage assessment does not replace that evaluation, and the importance of remaining in the ED until their evaluation is complete.    Montine Circle, PA-C 11/25/22 [phone]

## 2022-11-25 NOTE — Anesthesia Procedure Notes (Signed)
Procedure Name: Intubation Date/Time: 11/25/2022 8:29 AM  Performed by: Gean Maidens, CRNAPre-anesthesia Checklist: Patient identified, Emergency Drugs available, Suction available, Patient being monitored and Timeout performed Patient Re-evaluated:Patient Re-evaluated prior to induction Oxygen Delivery Method: Circle system utilized Preoxygenation: Pre-oxygenation with 100% oxygen Induction Type: IV induction Ventilation: Mask ventilation without difficulty Laryngoscope Size: Mac and 4 Grade View: Grade I Tube type: Oral Tube size: 7.0 mm Number of attempts: 1 Airway Equipment and Method: Stylet Placement Confirmation: ETT inserted through vocal cords under direct vision, positive ETCO2 and breath sounds checked- equal and bilateral Secured at: 21 cm Tube secured with: Tape Dental Injury: Teeth and Oropharynx as per pre-operative assessment

## 2022-11-25 NOTE — H&P (Signed)
Surgical Evaluation  Chief Complaint: abdominal pain  HPI: Very pleasant 46 year old woman who presents with right upper quadrant pain that began yesterday evening.  She had a similar but brief episode about 3 days prior associated with nausea and vomiting.  No improvement with omeprazole.  Other than that no prior similar symptoms.  Pain has been fairly constant in the right upper quadrant and she has had persistent nausea and vomiting all day yesterday.  A little bit more comfortable this morning after some pain medication.  Previous abdominal surgery includes laparoscopic  for large fibroid  She is currently unemployed, lost her job in June.  She has been helping take care of her mother who has what sounds like recurrent metastatic breast cancer  Allergies  Allergen Reactions   Eye Drops [Naphazoline-Polyethyl Glycol] Swelling    Past Medical History:  Diagnosis Date   Allergy    Anemia    Back spasm    Depression    Fibroid    HSV-2 (herpes simplex virus 2) infection    Joint pain    Knee problem    Migraine 10/19/2014   Obesity    Shortness of breath    Sleep apnea    Vitamin D deficiency    Vitamin deficiency    HX OF LOW VIT D     Past Surgical History:  Procedure Laterality Date   TUMOR REMOVAL     fibroid   VAGINAL DELIVERY     x 1    Family History  Problem Relation Age of Onset   Breast cancer Mother 64   Thyroid disease Mother    Obesity Mother    Diabetes Mother    Colon cancer Father 76   Diabetes Father    Heart disease Father    Hyperlipidemia Father    Hypertension Father    Sleep apnea Father    Alcoholism Father    Obesity Father    Migraines Brother    Breast cancer Maternal Aunt    Breast cancer Maternal Aunt    Breast cancer Maternal Grandmother 12   Diabetes Paternal Grandmother    Migraines Son    Esophageal cancer Neg Hx    Stomach cancer Neg Hx     Social History   Socioeconomic History   Marital status: Married    Spouse  name: engaged   Number of children: 1   Years of education: college   Highest education level: Not on file  Occupational History   Occupation: CUSTOMER SERVICE    Employer: [employer]  Tobacco Use   Smoking status: Never   Smokeless tobacco: Never  Vaping Use   Vaping Use: Never used  Substance and Sexual Activity   Alcohol use: Not Currently   Drug use: No   Sexual activity: Yes    Partners: Male    Birth control/protection: None  Other Topics Concern   Not on file  Social History Narrative   Lives with her son and her fiance.   Patient is left handed.   Patient drinks caffeine occasionally.   Social Determinants of Health   Financial Resource Strain: Not on file  Food Insecurity: Not on file  Transportation Needs: Not on file  Physical Activity: Not on file  Stress: Not on file  Social Connections: Not on file    No current facility-administered medications on file prior to encounter.   Current Outpatient Medications on File Prior to Encounter  Medication Sig Dispense Refill   cholecalciferol (VITAMIN  D3) 25 MCG (1000 UNIT) tablet Take 1,000 Units by mouth daily.     vitamin C (ASCORBIC ACID) 250 MG tablet Take 250 mg by mouth daily.     azelastine (ASTELIN) 0.1 % nasal spray Can use one to two sprays in each nostril twice daily if needed for runny nose. (Patient not taking: Reported on 10/08/2021) 30 mL 5    Review of Systems: a complete, 10pt review of systems was completed with pertinent positives and negatives as documented in the HPI  Physical Exam: Vitals:   11/25/22 0019  BP: (!) 123/101  Pulse: 99  Resp: 20  Temp: 98.6 F (37 C)  SpO2: 95%   Gen: A&Ox3, no distress  Eyes: lids and conjunctivae normal, no icterus. Pupils equally round and reactive to light.  Neck: supple without mass or thyromegaly Chest: respiratory effort is normal. No crepitus or tenderness on palpation of the chest. Breath sounds equal.  Cardiovascular: RRR with palpable  distal pulses, no pedal edema Gastrointestinal: soft, nondistended, focally tender in RUQ without peritoneal signs Lymphatic: no lymphadenopathy in the neck or groin Muscoloskeletal: no clubbing or cyanosis of the fingers.  Strength is symmetrical throughout.  Range of motion of bilateral upper and lower extremities normal without pain, crepitation or contracture. Neuro: cranial nerves grossly intact.  Sensation intact to light touch diffusely. Psych: appropriate mood and affect, normal insight/judgment intact  Skin: warm and dry      Latest Ref Rng & Units 11/25/2022   12:45 AM 03/18/2019   11:04 AM 05/26/2017   10:42 AM  CBC  WBC 4.0 - 10.5 K/uL 7.0  6.3  6.0   Hemoglobin 12.0 - 15.0 g/dL 12.5  13.4  11.9   Hematocrit 36.0 - 46.0 % 39.9  43.6  37.9   Platelets 150 - 400 K/uL 384  394  436        Latest Ref Rng & Units 11/25/2022   12:45 AM 03/18/2019   11:04 AM 05/26/2017   10:42 AM  CMP  Glucose 70 - 99 mg/dL 151  109  79   BUN 6 - 20 mg/dL '11  7  7   '$ Creatinine 0.44 - 1.00 mg/dL 0.77  0.75  0.77   Sodium 135 - 145 mmol/L 140  136  139   Potassium 3.5 - 5.1 mmol/L 3.4  3.5  5.0   Chloride 98 - 111 mmol/L 106  106  102   CO2 22 - 32 mmol/L '25  21  24   '$ Calcium 8.9 - 10.3 mg/dL 9.3  9.0  9.6   Total Protein 6.5 - 8.1 g/dL 7.9  7.9  7.3   Total Bilirubin 0.3 - 1.2 mg/dL 0.5  0.5  0.3   Alkaline Phos 38 - 126 U/L 69  58  59   AST 15 - 41 U/L '19  18  18   '$ ALT 0 - 44 U/L '15  13  11     '$ No results found for: "INR", "PROTIME"  Imaging: US Abdomen Limited  Result Date: 11/25/2022 CLINICAL DATA:  Right upper quadrant pain EXAM: ULTRASOUND ABDOMEN LIMITED RIGHT UPPER QUADRANT COMPARISON:  None Available. FINDINGS: Gallbladder: 1.1 cm gallstone is present. No wall thickening or pericholecystic fluid. Negative sonographic Murphy's sign. Common bile duct: Diameter: 6 mm.  No intrahepatic biliary dilation. Liver: No focal lesion identified. Within normal limits in parenchymal  echogenicity. Portal vein is patent on color Doppler imaging with normal direction of blood flow towards the liver. Other: None.  IMPRESSION: Cholelithiasis without evidence of cholecystitis. Electronically Signed   By: Placido Sou M.D.   On: 11/25/2022 01:37     A/P: Calculous cholecystitis. I recommend proceeding with laparoscopic cholecystectomy with possible cholangiogram. We went over the relevant anatomy and discussed risks of surgery including bleeding, pain, scarring, intraabdominal injury specifically to the common bile duct and sequelae, bile leak, conversion to open surgery, subtotal cholecystectomy, blood clot, pneumonia, heart attack, stroke, failure to resolve symptoms, etc. Questions welcomed and answered. Possible discharge from PACU depending on intra-op findings.    Patient Active Problem List   Diagnosis Date Noted   Menorrhagia 12/27/2019   Hiatal hernia 03/31/2017   Sleep disturbance 03/31/2017   Abnormal mammogram of left breast 12/01/2014   Migraine 10/19/2014   Appetite disorder 10/13/2013   Extreme obesity 10/13/2013   Back spasm 04/14/2013   Fibroid    Vitamin D deficiency    HSV-2 (herpes simplex virus 2) infection        Romana Juniper, MD [facility]  See AMION to contact appropriate on-call provider

## 2022-11-25 NOTE — Progress Notes (Signed)
Call placed to Dr. Tobias Alexander anesthesia to notify of patients continued c/o pain level greater than 5. Per Dr. Tobias Alexander give 1-2 of versed IV, may give up to 168mg of additional fentanyl, notify Dr. CWindle Guardif better pain control not achieved following these orders.

## 2022-11-25 NOTE — Anesthesia Preprocedure Evaluation (Addendum)
Anesthesia Evaluation  Patient identified by MRN, date of birth, ID band Patient awake    Reviewed: Allergy & Precautions, NPO status , Patient's Chart, lab work & pertinent test results  Airway Mallampati: II  TM Distance: >3 FB Neck ROM: Full    Dental  (+) Dental Advisory Given, Missing   Pulmonary sleep apnea    Pulmonary exam normal breath sounds clear to auscultation       Cardiovascular negative cardio ROS Normal cardiovascular exam Rhythm:Regular Rate:Normal     Neuro/Psych  Headaches PSYCHIATRIC DISORDERS  Depression       GI/Hepatic hiatal hernia,,, Cholecystitis    Endo/Other    Morbid obesity  Renal/GU negative Renal ROS     Musculoskeletal negative musculoskeletal ROS (+)    Abdominal   Peds  Hematology negative hematology ROS (+)   Anesthesia Other Findings Day of surgery medications reviewed with the patient.  Reproductive/Obstetrics negative OB ROS                             Anesthesia Physical Anesthesia Plan  ASA: 3  Anesthesia Plan: General   Post-op Pain Management: Ofirmev IV (intra-op)* and Toradol IV (intra-op)*   Induction: Intravenous and Rapid sequence  PONV Risk Score and Plan: 3 and Midazolam, Dexamethasone, Ondansetron and Scopolamine patch - Pre-op  Airway Management Planned: Oral ETT  Additional Equipment:   Intra-op Plan:   Post-operative Plan: Extubation in OR  Informed Consent: I have reviewed the patients History and Physical, chart, labs and discussed the procedure including the risks, benefits and alternatives for the proposed anesthesia with the patient or authorized representative who has indicated his/her understanding and acceptance.     Dental advisory given  Plan Discussed with: CRNA  Anesthesia Plan Comments:        Anesthesia Quick Evaluation

## 2022-11-26 ENCOUNTER — Encounter (HOSPITAL_COMMUNITY): Payer: Self-pay | Admitting: Surgery

## 2022-11-26 LAB — SURGICAL PATHOLOGY

## 2023-02-13 ENCOUNTER — Emergency Department (HOSPITAL_COMMUNITY): Payer: Medicaid Other

## 2023-02-13 ENCOUNTER — Emergency Department (HOSPITAL_COMMUNITY): Payer: No Typology Code available for payment source

## 2023-02-13 ENCOUNTER — Emergency Department (HOSPITAL_COMMUNITY)
Admission: EM | Admit: 2023-02-13 | Discharge: 2023-02-13 | Disposition: A | Discharge status: Home / Self Care | Payer: No Typology Code available for payment source | Attending: Emergency Medicine | Admitting: Emergency Medicine

## 2023-02-13 ENCOUNTER — Encounter (HOSPITAL_COMMUNITY): Payer: Self-pay

## 2023-02-13 DIAGNOSIS — M545 Low back pain, unspecified: Secondary | ICD-10-CM | POA: Diagnosis present

## 2023-02-13 LAB — PREGNANCY, URINE: Preg Test, Ur: NEGATIVE

## 2023-02-13 MED ORDER — IBUPROFEN 800 MG PO TABS
800.0000 mg | ORAL_TABLET | Freq: Once | ORAL | Status: AC
  Administered 2023-02-13: 800 mg via ORAL
  Filled 2023-02-13: qty 1

## 2023-02-13 NOTE — ED Provider Notes (Signed)
[place] EMERGENCY DEPARTMENT AT [facility] Provider Note   CSN: [id number] Arrival date & time: 02/13/23  0550     History  Chief Complaint  Patient presents with   Back Pain    Lauren Daniels is a 47 y.o. female with history of fibroids, vitamin D deficiency, migraines, sleep apnea who presents the emergency department complaining of lower back pain after an injury while at work.  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  Lauren Daniels did not fall to the ground.  States that Lauren Daniels really did not have much pain, but after Lauren Daniels took her break and was walking around at work, the pain got significantly worse.  Made worse with moving and walking.  Pain localized in the lower back, does not radiate.  No numbness, weakness, urinary symptoms.   Back Pain      Home Medications Prior to Admission medications   Medication Sig Start Date End Date Taking? Authorizing Provider  cholecalciferol (VITAMIN D3) 25 MCG (1000 UNIT) tablet Take 1,000 Units by mouth daily.    [provider]  vitamin C (ASCORBIC ACID) 250 MG tablet Take 250 mg by mouth daily.    [provider]      Allergies    Eye drops [naphazoline-polyethyl glycol]    Review of Systems   Review of Systems  Musculoskeletal:  Positive for back pain.  All other systems reviewed and are negative.   Physical Exam Updated Vital Signs BP (!) 151/111 (BP Location: Left Arm)   Pulse 73   Temp 98 F (36.7 C) (Oral)   Resp 16   SpO2 100%  Physical Exam Vitals and nursing note reviewed.  Constitutional:      Appearance: Normal appearance.  HENT:     Head: Normocephalic and atraumatic.  Eyes:     Conjunctiva/sclera: Conjunctivae normal.  Pulmonary:     Effort: Pulmonary effort is normal. No respiratory distress.  Musculoskeletal:     Comments: No midline spinal tenderness, step-offs or crepitus.  Some lower lumbar muscle tenderness bilaterally.   Strength 5/5 in all extremities.  Sensation intact in all extremities.  Ambulating unassisted with antalgic gait  Skin:    General: Skin is warm and dry.  Neurological:     Mental Status: Lauren Daniels is alert.  Psychiatric:        Mood and Affect: Mood normal.        Behavior: Behavior normal.     ED Results / Procedures / Treatments   Labs (all labs ordered are listed, but only abnormal results are displayed) Labs Reviewed  PREGNANCY, URINE    EKG None  Radiology No results found.  Procedures Procedures    Medications Ordered in ED Medications  ibuprofen (ADVIL) tablet 800 mg (has no administration in time range)    ED Course/ Medical Decision Making/ A&P                             Medical Decision Making Amount and/or Complexity of Data Reviewed Radiology: ordered.  Risk Prescription drug management.  This patient is a 47 y.o. female who presents to the ED for concern of lower back pain after injury at work.   Differential diagnoses prior to evaluation: Fracture (acute/chronic), muscle strain, cauda equina / myelopathy, spinal stenosis, DDD, ligamentous injury, disk herniation, radiculopathy  Past Medical History / Social History / Additional history: Chart reviewed. Pertinent  results include: fibroids, vitamin D deficiency, migraines, sleep apnea  Physical Exam: Physical exam performed. The pertinent findings include: No midline spinal tenderness, step-offs or crepitus.  Lower lumbar muscular tenderness bilaterally.  Strength and sensation in all extremities.  Ambulating unassisted with antalgic gait.  Imaging: I personally ordered and reviewed the following images: x-ray of lumbar spine. Results pending at time of shift change  Medications / Treatment: Given ibuprofen   Disposition: Patient discussed and care transferred to [facility] PA-C at shift change. Please see his/her note for further details regarding further ED course and disposition. Plan at time  of handoff is follow up on x-rays and likely d/c to home with symptomatic management of lower back injury. No red flag symptoms, no neuro deficits.   Final Clinical Impression(s) / ED Diagnoses Final diagnoses:  Acute bilateral low back pain without sciatica    Rx / DC Orders ED Discharge Orders     None      Portions of this report may have been transcribed using voice recognition software. Every effort was made to ensure accuracy; however, inadvertent computerized transcription errors may be present.    Estill Cotta 02/13/23 [id number]    Ripley Fraise, MD 02/13/23 0700

## 2023-02-13 NOTE — Discharge Instructions (Addendum)
You were seen in the emergency department today for back pain.   Rest, gentle exercise and stretching will aid in recovery from any injuries to your back.  Using medication such as Tylenol and ibuprofen will help alleviate pain as well as decrease swelling and inflammation associated with these injuries. You may use 600 mg ibuprofen every 6 hours or 1000 mg of Tylenol every 6 hours.  You may choose to alternate between the 2.  This would be most effective.  Not to exceed 4 g of Tylenol within 24 hours.  Not to exceed 3200 mg ibuprofen 24 hours.  Salt water/Epson salt soaks, massage, icy hot/Biofreeze/BenGay and other similar products can help with symptoms.  Please return to the emergency department for reevaluation if you denies any new or concerning symptoms such as fever, new numbness or weakness in your legs, difficulty using the restroom or incontinence.
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
## 2023-02-16 ENCOUNTER — Emergency Department (HOSPITAL_COMMUNITY)
Admission: EM | Admit: 2023-02-16 | Discharge: 2023-02-16 | Disposition: A | Discharge status: Home / Self Care | Payer: Medicaid Other | Attending: Emergency Medicine | Admitting: Emergency Medicine

## 2023-02-16 DIAGNOSIS — W240XXA Contact with lifting devices, not elsewhere classified, initial encounter: Secondary | ICD-10-CM | POA: Insufficient documentation

## 2023-02-16 DIAGNOSIS — S0990XA Unspecified injury of head, initial encounter: Secondary | ICD-10-CM | POA: Diagnosis present

## 2023-02-16 DIAGNOSIS — S060X0A Concussion without loss of consciousness, initial encounter: Secondary | ICD-10-CM | POA: Diagnosis not present

## 2023-02-16 DIAGNOSIS — Y990 Civilian activity done for income or pay: Secondary | ICD-10-CM | POA: Insufficient documentation

## 2023-02-16 MED ORDER — ONDANSETRON 4 MG PO TBDP: 4.0000 mg | ORAL_TABLET | Freq: Three times a day (TID) | ORAL | 0 refills | Status: AC | PRN

## 2023-02-16 NOTE — Discharge Instructions (Signed)
Your exam today was overall reassuring.  Symptoms you are describing could be consistent with concussion.  The symptoms typically last about 7 days.  If they last longer than 7 days have given information for concussion clinic that you can follow-up with.  Take Tylenol and ibuprofen as you need to for your low back pain.  For any concerning symptoms, return to the emergency department.  I have sent nausea medication for you to keep on hand for any nausea or vomiting.
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
## 2023-02-16 NOTE — ED Triage Notes (Signed)
Patient arrived with complaints of ongoing neck and back pain after an injury at work. States she is now having a headache and feeling more tired than normal.

## 2023-04-03 ENCOUNTER — Ambulatory Visit
Admission: EM | Admit: 2023-04-03 | Discharge: 2023-04-03 | Disposition: A | Discharge status: Home / Self Care | Payer: Medicaid Other | Attending: Internal Medicine | Admitting: Internal Medicine

## 2023-04-03 DIAGNOSIS — R059 Cough, unspecified: Secondary | ICD-10-CM | POA: Diagnosis not present

## 2023-04-03 DIAGNOSIS — J069 Acute upper respiratory infection, unspecified: Secondary | ICD-10-CM | POA: Diagnosis not present

## 2023-04-03 DIAGNOSIS — Z1152 Encounter for screening for COVID-19: Secondary | ICD-10-CM | POA: Diagnosis not present

## 2023-04-03 LAB — POCT INFLUENZA A/B
Influenza A, POC: NEGATIVE
Influenza B, POC: NEGATIVE

## 2023-04-03 MED ORDER — FLUTICASONE PROPIONATE 50 MCG/ACT NA SUSP: 1.0000 | Freq: Every day | NASAL | 0 refills | Status: AC

## 2023-04-03 MED ORDER — GUAIFENESIN 200 MG PO TABS: 200.0000 mg | ORAL_TABLET | ORAL | 0 refills | Status: AC | PRN

## 2023-04-03 NOTE — Discharge Instructions (Signed)
Rapid flu is negative.  COVID test pending.  As we discussed, it appears they you have a viral illness that should run its course.  I have prescribed 2 medications to alleviate symptoms.  Please follow-up if any symptoms persist or worsen.

## 2023-04-03 NOTE — ED Triage Notes (Signed)
Patient c/o a productive cough with green sputum, nasal and chest congestion, and nausea since last night.  Patient states she took Nyquil and an herbal vitamin last night.

## 2023-04-03 NOTE — ED Provider Notes (Signed)
[facility]    CSN: [id number] Arrival date & time: 04/03/23  1150      History   Chief Complaint Chief Complaint  Patient presents with   Cough   Nausea   Nasal Congestion    HPI Lauren Daniels is a 47 y.o. female.   Patient presents with productive cough, nasal congestion, feelings of chest congestion, nausea without vomiting that started last night.  Patient has taken Echinacea and NyQuil with minimal improvement in symptoms.  Reports chest discomfort with coughing only but is not reporting any shortness of breath.  Denies any diarrhea.  Patient denies history of asthma.  Denies any known sick contacts or fevers.  Patient is requesting flu and COVID testing given that she lives with elderly family members.   Cough   Past Medical History:  Diagnosis Date   Allergy    Anemia    Back spasm    Depression    Fibroid    HSV-2 (herpes simplex virus 2) infection    Joint pain    Knee problem    Migraine 10/19/2014   Obesity    Shortness of breath    Sleep apnea    Vitamin D deficiency    Vitamin deficiency    HX OF LOW VIT D     Patient Active Problem List   Diagnosis Date Noted   Acute calculous cholecystitis 11/25/2022   Menorrhagia 12/27/2019   Hiatal hernia 03/31/2017   Sleep disturbance 03/31/2017   Abnormal mammogram of left breast 12/01/2014   Migraine 10/19/2014   Appetite disorder 10/13/2013   Extreme obesity 10/13/2013   Back spasm 04/14/2013   Fibroid    Vitamin D deficiency    HSV-2 (herpes simplex virus 2) infection     Past Surgical History:  Procedure Laterality Date   CHOLECYSTECTOMY N/A 11/25/2022   Procedure: LAPAROSCOPIC CHOLECYSTECTOMY;  Surgeon: Berna Bue, MD;  Location: [facility];  Service: General;  Laterality: N/A;   TUMOR REMOVAL     fibroid   VAGINAL DELIVERY     x 1    OB History     Gravida  4   Para  2   Term  1   Preterm  1   AB  2   Living  1      SAB  2   IAB      Ectopic       Multiple      Live Births  1            Home Medications    Prior to Admission medications   Medication Sig Start Date End Date Taking? Authorizing Provider  fluticasone (FLONASE) 50 MCG/ACT nasal spray Place 1 spray into both nostrils daily. 04/03/23  Yes Mykeisha Dysert, Rolly Salter E, FNP  guaiFENesin 200 MG tablet Take 1 tablet (200 mg total) by mouth every 4 (four) hours as needed for cough or to loosen phlegm. 04/03/23  Yes Hollyn Stucky, Rolly Salter E, FNP  cholecalciferol (VITAMIN D3) 25 MCG (1000 UNIT) tablet Take 1,000 Units by mouth daily.    [provider]  ondansetron (ZOFRAN-ODT) 4 MG disintegrating tablet Take 1 tablet (4 mg total) by mouth every 8 (eight) hours as needed. 02/16/23   Marita Kansas, PA-C  vitamin C (ASCORBIC ACID) 250 MG tablet Take 250 mg by mouth daily.    [provider]    Family History Family History  Problem Relation Age of Onset   Breast cancer Mother 9   Thyroid disease  Mother    Obesity Mother    Diabetes Mother    Colon cancer Father 48   Diabetes Father    Heart disease Father    Hyperlipidemia Father    Hypertension Father    Sleep apnea Father    Alcoholism Father    Obesity Father    Migraines Brother    Breast cancer Maternal Aunt    Breast cancer Maternal Aunt    Breast cancer Maternal Grandmother 28   Diabetes Paternal Grandmother    Migraines Son    Esophageal cancer Neg Hx    Stomach cancer Neg Hx     Social History Social History   Tobacco Use   Smoking status: Never   Smokeless tobacco: Never  Vaping Use   Vaping Use: Never used  Substance Use Topics   Alcohol use: Not Currently   Drug use: No     Allergies   Eye drops [naphazoline-polyethyl glycol]   Review of Systems Review of Systems Per HPI  Physical Exam Triage Vital Signs ED Triage Vitals  Enc Vitals Group     BP 04/03/23 1244 (!) 133/98     Pulse Rate 04/03/23 1244 80     Resp 04/03/23 1244 18     Temp 04/03/23 1244 (!) 97.2 F (36.2 C)     Temp  Source 04/03/23 1244 Oral     SpO2 04/03/23 1244 97 %     Weight --      Height --      Head Circumference --      Peak Flow --      Pain Score 04/03/23 1246 0     Pain Loc --      Pain Edu? --      Excl. in GC? --    No data found.  Updated Vital Signs BP (!) 133/98 (BP Location: Right Arm)   Pulse 80   Temp (!) 97.2 F (36.2 C) (Oral)   Resp 18   SpO2 97%   Visual Acuity Right Eye Distance:   Left Eye Distance:   Bilateral Distance:    Right Eye Near:   Left Eye Near:    Bilateral Near:     Physical Exam Constitutional:      General: She is not in acute distress.    Appearance: Normal appearance. She is not toxic-appearing or diaphoretic.  HENT:     Head: Normocephalic and atraumatic.     Right Ear: Tympanic membrane and ear canal normal.     Left Ear: Tympanic membrane and ear canal normal.     Nose: Congestion present.     Mouth/Throat:     Mouth: Mucous membranes are moist.     Pharynx: No posterior oropharyngeal erythema.  Eyes:     Extraocular Movements: Extraocular movements intact.     Conjunctiva/sclera: Conjunctivae normal.     Pupils: Pupils are equal, round, and reactive to light.  Cardiovascular:     Rate and Rhythm: Normal rate and regular rhythm.     Pulses: Normal pulses.     Heart sounds: Normal heart sounds.  Pulmonary:     Effort: Pulmonary effort is normal. No respiratory distress.     Breath sounds: Normal breath sounds. No stridor. No wheezing, rhonchi or rales.  Abdominal:     General: Abdomen is flat. Bowel sounds are normal.     Palpations: Abdomen is soft.  Musculoskeletal:        General: Normal range of motion.  Cervical back: Normal range of motion.  Skin:    General: Skin is warm and dry.  Neurological:     General: No focal deficit present.     Mental Status: She is alert and oriented to person, place, and time. Mental status is at baseline.  Psychiatric:        Mood and Affect: Mood normal.        Behavior: Behavior  normal.      UC Treatments / Results  Labs (all labs ordered are listed, but only abnormal results are displayed) Labs Reviewed  SARS CORONAVIRUS 2 (TAT 6-24 HRS)  POCT INFLUENZA A/B    EKG   Radiology No results found.  Procedures Procedures (including critical care time)  Medications Ordered in UC Medications - No data to display  Initial Impression / Assessment and Plan / UC Course  I have reviewed the triage vital signs and the nursing notes.  Pertinent labs & imaging results that were available during my care of the patient were reviewed by me and considered in my medical decision making (see chart for details).     Patient presents with symptoms likely from a viral upper respiratory infection. Do not suspect underlying cardiopulmonary process. Symptoms seem unlikely related to ACS, CHF or COPD exacerbations, pneumonia, pneumothorax. Patient is nontoxic appearing and not in need of emergent medical intervention.  Rapid flu is negative.  Do not have flu PCR capabilities here in urgent care at this time.  COVID test pending.  Recommended symptom control with medications and supportive care.  Patient was sent guaifenesin and Flonase.  Return if symptoms fail to improve in 1-2 weeks or you develop shortness of breath, chest pain, severe headache. Patient states understanding and is agreeable.  Discharged with PCP followup.  Final Clinical Impressions(s) / UC Diagnoses   Final diagnoses:  Viral upper respiratory tract infection with cough     Discharge Instructions      Rapid flu is negative.  COVID test pending.  As we discussed, it appears they you have a viral illness that should run its course.  I have prescribed 2 medications to alleviate symptoms.  Please follow-up if any symptoms persist or worsen.    ED Prescriptions     Medication Sig Dispense Auth. Provider   fluticasone (FLONASE) 50 MCG/ACT nasal spray Place 1 spray into both nostrils daily. 16 g  Ervin Knack E, [place]   guaiFENesin 200 MG tablet Take 1 tablet (200 mg total) by mouth every 4 (four) hours as needed for cough or to loosen phlegm. 30 tablet [place], Acie Fredrickson, [place]      PDMP not reviewed this encounter.   Gustavus Bryant, [place] 04/03/23 1409

## 2023-04-04 LAB — SARS CORONAVIRUS 2 (TAT 6-24 HRS): SARS Coronavirus 2: NEGATIVE

## 2023-12-26 ENCOUNTER — Other Ambulatory Visit: Payer: Self-pay

## 2023-12-26 ENCOUNTER — Emergency Department (HOSPITAL_BASED_OUTPATIENT_CLINIC_OR_DEPARTMENT_OTHER)
Admission: EM | Admit: 2023-12-26 | Discharge: 2023-12-27 | Disposition: A | Discharge status: Home / Self Care | Payer: Medicaid Other | Attending: Emergency Medicine | Admitting: Emergency Medicine

## 2023-12-26 DIAGNOSIS — Z20822 Contact with and (suspected) exposure to covid-19: Secondary | ICD-10-CM | POA: Diagnosis not present

## 2023-12-26 DIAGNOSIS — J101 Influenza due to other identified influenza virus with other respiratory manifestations: Secondary | ICD-10-CM | POA: Insufficient documentation

## 2023-12-26 DIAGNOSIS — R059 Cough, unspecified: Secondary | ICD-10-CM | POA: Diagnosis present

## 2023-12-26 LAB — URINALYSIS, ROUTINE W REFLEX MICROSCOPIC
Bacteria, UA: NONE SEEN
Bilirubin Urine: NEGATIVE
Glucose, UA: NEGATIVE mg/dL
Ketones, ur: 15 mg/dL — AB
Leukocytes,Ua: NEGATIVE
Nitrite: NEGATIVE
Protein, ur: 30 mg/dL — AB
Specific Gravity, Urine: 1.026 (ref 1.005–1.030)
pH: 5.5 (ref 5.0–8.0)

## 2023-12-26 LAB — CBC
HCT: 38.9 % (ref 36.0–46.0)
Hemoglobin: 12.6 g/dL (ref 12.0–15.0)
MCH: 28.3 pg (ref 26.0–34.0)
MCHC: 32.4 g/dL (ref 30.0–36.0)
MCV: 87.2 fL (ref 80.0–100.0)
Platelets: 237 10*3/uL (ref 150–400)
RBC: 4.46 MIL/uL (ref 3.87–5.11)
RDW: 13.4 % (ref 11.5–15.5)
WBC: 4.4 10*3/uL (ref 4.0–10.5)
nRBC: 0 % (ref 0.0–0.2)

## 2023-12-26 LAB — BASIC METABOLIC PANEL
Anion gap: 9 (ref 5–15)
BUN: 6 mg/dL (ref 6–20)
CO2: 23 mmol/L (ref 22–32)
Calcium: 8.8 mg/dL — ABNORMAL LOW (ref 8.9–10.3)
Chloride: 102 mmol/L (ref 98–111)
Creatinine, Ser: 0.75 mg/dL (ref 0.44–1.00)
GFR, Estimated: >60 mL/min (ref >60)
Glucose, Bld: 92 mg/dL (ref 70–99)
Potassium: 3.9 mmol/L (ref 3.5–5.1)
Sodium: 134 mmol/L — ABNORMAL LOW (ref 135–145)

## 2023-12-26 LAB — RESP PANEL BY RT-PCR (RSV, FLU A&B, COVID)  RVPGX2
Influenza A by PCR: POSITIVE — AB
Influenza B by PCR: NEGATIVE
Resp Syncytial Virus by PCR: NEGATIVE
SARS Coronavirus 2 by RT PCR: NEGATIVE

## 2023-12-26 MED ORDER — SODIUM CHLORIDE 0.9 % IV BOLUS
1000.0000 mL | Freq: Once | INTRAVENOUS | Status: AC
  Administered 2023-12-26: 1000 mL via INTRAVENOUS

## 2023-12-26 MED ORDER — PROCHLORPERAZINE EDISYLATE 10 MG/2ML IJ SOLN
10.0000 mg | Freq: Once | INTRAMUSCULAR | Status: AC
  Administered 2023-12-27: 10 mg via INTRAVENOUS
  Filled 2023-12-26: qty 2

## 2023-12-26 MED ORDER — KETOROLAC TROMETHAMINE 30 MG/ML IJ SOLN
15.0000 mg | Freq: Once | INTRAMUSCULAR | Status: AC
  Administered 2023-12-27: 15 mg via INTRAVENOUS
  Filled 2023-12-26: qty 1

## 2023-12-26 NOTE — ED Provider Notes (Signed)
[place] EMERGENCY DEPARTMENT AT [facility]  Provider Note  CSN: [id number] Arrival date & time: 12/26/23 2100  History Chief Complaint  Patient presents with   Nasal Congestion   Cough    Lauren Daniels is a 48 y.o. female with 2 days of flu-like symptoms including cough, fever, nasal congestion, headaches and nausea with poor PO intake. She has been seen at UC twice in the last 2 days with negative labs there, but ODT zofran is not helping her nausea and she is worried about being dehydrated.    Home Medications Prior to Admission medications   Medication Sig Start Date End Date Taking? Authorizing Provider  prochlorperazine (COMPAZINE) 10 MG tablet Take 1 tablet (10 mg total) by mouth 2 (two) times daily as needed for nausea or vomiting. 12/27/23  Yes Pollyann Savoy, MD  cholecalciferol (VITAMIN D3) 25 MCG (1000 UNIT) tablet Take 1,000 Units by mouth daily.    [provider]  fluticasone (FLONASE) 50 MCG/ACT nasal spray Place 1 spray into both nostrils daily. 04/03/23   Gustavus Bryant, FNP  guaiFENesin 200 MG tablet Take 1 tablet (200 mg total) by mouth every 4 (four) hours as needed for cough or to loosen phlegm. 04/03/23   Gustavus Bryant, FNP  ondansetron (ZOFRAN-ODT) 4 MG disintegrating tablet Take 1 tablet (4 mg total) by mouth every 8 (eight) hours as needed. 02/16/23   Marita Kansas, PA-C  vitamin C (ASCORBIC ACID) 250 MG tablet Take 250 mg by mouth daily.    [provider]     Allergies    Eye drops [naphazoline-polyethyl glycol]   Review of Systems   Review of Systems Please see HPI for pertinent positives and negatives  Physical Exam BP 134/83   Pulse 85   Temp 99.6 F (37.6 C)   Resp 20   SpO2 95%   Physical Exam Vitals and nursing note reviewed.  Constitutional:      Appearance: Normal appearance.  HENT:     Head: Normocephalic and atraumatic.     Nose: Nose normal.     Mouth/Throat:     Mouth: Mucous membranes are moist.   Eyes:     Extraocular Movements: Extraocular movements intact.     Conjunctiva/sclera: Conjunctivae normal.  Cardiovascular:     Rate and Rhythm: Normal rate.  Pulmonary:     Effort: Pulmonary effort is normal.     Breath sounds: Normal breath sounds.  Abdominal:     General: Abdomen is flat.     Palpations: Abdomen is soft.     Tenderness: There is no abdominal tenderness.  Musculoskeletal:        General: No swelling. Normal range of motion.     Cervical back: Neck supple.  Skin:    General: Skin is warm and dry.  Neurological:     General: No focal deficit present.     Mental Status: She is alert.  Psychiatric:        Mood and Affect: Mood normal.     ED Results / Procedures / Treatments   EKG None  Procedures Procedures  Medications Ordered in the ED Medications  prochlorperazine (COMPAZINE) 10 MG/2ML injection (  Not Given 12/27/23 0015)  sodium chloride 0.9 % bolus 1,000 mL (1,000 mLs Intravenous New Bag/Given 12/26/23 2356)  prochlorperazine (COMPAZINE) injection 10 mg (10 mg Intravenous Given 12/27/23 0002)  ketorolac (TORADOL) 30 MG/ML injection 15 mg (15 mg Intravenous Given 12/27/23 0002)    Initial Impression and  Plan  Patient here with 2+ days of viral symptoms, labs done in triage show normal CBC, BMP and UA with small ketones but no infection. Flu is positive. Will give a liter of IVF and compazine for symptoms and reassess for dispo. She has not tolerate tamiflu well in the past, unlikely she would benefit from it at this point either.   ED Course   Clinical Course as of 12/27/23 0051  Wynelle Link Dec 27, 2023  0049 Patient resting comfortably. HR improved. Plan discharge with Rx for compazine, otherwise continue with oral hydration, supportive care and PCP follow up, RTED for any other concerns.   [CS]    Clinical Course User Index [CS] Pollyann Savoy, MD     MDM Rules/Calculators/A&P Medical Decision Making Problems Addressed: Influenza A: acute  illness or injury  Amount and/or Complexity of Data Reviewed Labs: ordered. Decision-making details documented in ED Course.  Risk Prescription drug management.     Final Clinical Impression(s) / ED Diagnoses Final diagnoses:  Influenza A    Rx / DC Orders ED Discharge Orders          Ordered    prochlorperazine (COMPAZINE) 10 MG tablet  2 times daily PRN        12/27/23 0051             Pollyann Savoy, MD 12/27/23 [id number]

## 2023-12-26 NOTE — ED Triage Notes (Signed)
Fever, congestion, HA x2 days. Extensive workup at [facility] yesterday.

## 2023-12-27 MED ORDER — PROCHLORPERAZINE EDISYLATE 10 MG/2ML IJ SOLN
INTRAMUSCULAR | Status: AC
  Filled 2023-12-27: qty 2

## 2023-12-27 MED ORDER — PROCHLORPERAZINE MALEATE 10 MG PO TABS: 10.0000 mg | ORAL_TABLET | Freq: Two times a day (BID) | ORAL | 0 refills | Status: AC | PRN

## 2023-12-28 ENCOUNTER — Other Ambulatory Visit: Payer: Self-pay

## 2023-12-28 ENCOUNTER — Emergency Department (HOSPITAL_COMMUNITY)
Admission: EM | Admit: 2023-12-28 | Discharge: 2023-12-28 | Disposition: A | Discharge status: Home / Self Care | Payer: Medicaid Other | Attending: Student | Admitting: Student

## 2023-12-28 ENCOUNTER — Emergency Department (HOSPITAL_COMMUNITY): Payer: Medicaid Other

## 2023-12-28 ENCOUNTER — Encounter (HOSPITAL_COMMUNITY): Payer: Self-pay | Admitting: Emergency Medicine

## 2023-12-28 DIAGNOSIS — R110 Nausea: Secondary | ICD-10-CM | POA: Diagnosis present

## 2023-12-28 DIAGNOSIS — J111 Influenza due to unidentified influenza virus with other respiratory manifestations: Secondary | ICD-10-CM | POA: Diagnosis not present

## 2023-12-28 DIAGNOSIS — D72819 Decreased white blood cell count, unspecified: Secondary | ICD-10-CM | POA: Diagnosis not present

## 2023-12-28 DIAGNOSIS — E860 Dehydration: Secondary | ICD-10-CM | POA: Diagnosis not present

## 2023-12-28 LAB — CBC WITH DIFFERENTIAL/PLATELET
Abs Immature Granulocytes: 0 10*3/uL (ref 0.00–0.07)
Basophils Absolute: 0 10*3/uL (ref 0.0–0.1)
Basophils Relative: 0 %
Eosinophils Absolute: 0 10*3/uL (ref 0.0–0.5)
Eosinophils Relative: 0 %
HCT: 45.6 % (ref 36.0–46.0)
Hemoglobin: 14.4 g/dL (ref 12.0–15.0)
Immature Granulocytes: 0 %
Lymphocytes Relative: 44 %
Lymphs Abs: 1.3 10*3/uL (ref 0.7–4.0)
MCH: 28.3 pg (ref 26.0–34.0)
MCHC: 31.6 g/dL (ref 30.0–36.0)
MCV: 89.6 fL (ref 80.0–100.0)
Monocytes Absolute: 0.4 10*3/uL (ref 0.1–1.0)
Monocytes Relative: 14 %
Neutro Abs: 1.2 10*3/uL — ABNORMAL LOW (ref 1.7–7.7)
Neutrophils Relative %: 42 %
Platelets: 244 10*3/uL (ref 150–400)
RBC: 5.09 MIL/uL (ref 3.87–5.11)
RDW: 13.4 % (ref 11.5–15.5)
WBC: 2.9 10*3/uL — ABNORMAL LOW (ref 4.0–10.5)
nRBC: 0 % (ref 0.0–0.2)

## 2023-12-28 LAB — I-STAT CHEM 8, ED
BUN: 12 mg/dL (ref 6–20)
Calcium, Ion: 1.02 mmol/L — ABNORMAL LOW (ref 1.15–1.40)
Chloride: 102 mmol/L (ref 98–111)
Creatinine, Ser: 0.9 mg/dL (ref 0.44–1.00)
Glucose, Bld: 95 mg/dL (ref 70–99)
HCT: 42 % (ref 36.0–46.0)
Hemoglobin: 14.3 g/dL (ref 12.0–15.0)
Potassium: 4.7 mmol/L (ref 3.5–5.1)
Sodium: 136 mmol/L (ref 135–145)
TCO2: 27 mmol/L (ref 22–32)

## 2023-12-28 LAB — TROPONIN I (HIGH SENSITIVITY): Troponin I (High Sensitivity): 4 ng/L (ref <18)

## 2023-12-28 MED ORDER — ONDANSETRON 4 MG PO TBDP: 4.0000 mg | ORAL_TABLET | Freq: Three times a day (TID) | ORAL | 0 refills | Status: AC | PRN

## 2023-12-28 MED ORDER — NYSTATIN 100000 UNIT/ML MT SUSP
500000.0000 [IU] | Freq: Four times a day (QID) | OROMUCOSAL | 0 refills | Status: DC
  Filled 2023-12-28: qty 60, 3d supply, fill #0

## 2023-12-28 MED ORDER — NAPROXEN 375 MG PO TABS: 375.0000 mg | ORAL_TABLET | Freq: Two times a day (BID) | ORAL | 0 refills | Status: AC

## 2023-12-28 MED ORDER — ACETAMINOPHEN 500 MG PO TABS
1000.0000 mg | ORAL_TABLET | Freq: Once | ORAL | Status: AC
  Administered 2023-12-28: 1000 mg via ORAL
  Filled 2023-12-28: qty 2

## 2023-12-28 MED ORDER — NYSTATIN 100000 UNIT/ML MT SUSP: 500000.0000 [IU] | Freq: Four times a day (QID) | OROMUCOSAL | 0 refills | Status: AC

## 2023-12-28 MED ORDER — NAPROXEN 375 MG PO TABS
375.0000 mg | ORAL_TABLET | Freq: Two times a day (BID) | ORAL | 0 refills | Status: DC
  Filled 2023-12-28: qty 20, 10d supply, fill #0

## 2023-12-28 MED ORDER — ONDANSETRON 4 MG PO TBDP
4.0000 mg | ORAL_TABLET | Freq: Three times a day (TID) | ORAL | 0 refills | Status: DC | PRN
  Filled 2023-12-28: qty 20, 7d supply, fill #0

## 2023-12-28 MED ORDER — LACTATED RINGERS IV BOLUS
1000.0000 mL | Freq: Once | INTRAVENOUS | Status: AC
  Administered 2023-12-28: 1000 mL via INTRAVENOUS

## 2023-12-28 NOTE — ED Triage Notes (Signed)
Patient presents via EMS due to nausea, headaches and lightheadedness with standing for the past 3 days. She was seen 2 days ago at Urgent care for this, and yesterday at the hospitals (bolus given). Symptom continue to worsen. Denies vomiting and diarrhea. EMS noted orthostatic changes. They also administered 4 mg of Zofran. Patient reported to them taking Tylenol with Codeine around 1400 today.   EMS vitals: 116 palpated (sitting), 60 palpated (standing) BP 108 (sitting), 138 (standing) HR 20 RR 97% SPO2 on room air 112 CBG

## 2023-12-28 NOTE — ED Provider Notes (Signed)
[facility] AT [facility] Provider Note  CSN: [id number] Arrival date & time: 12/28/23 1814  Chief Complaint(s) Headache, Dizziness, and Nausea  HPI Lauren Daniels is a 48 y.o. female with PMH anemia, depression, recent ER presentation where she tested positive for influenza who presents emergency room for evaluation of persistent flulike symptoms such as headache, dizziness nausea and orthostatic presyncope.  States that since ER discharge she has had persistent headaches, myalgias and difficulty tolerating p.o.  This morning she attempted to get out of bed and suffered a presyncopal episode.  Patient reportedly orthostatic in the field by EMS and received 1 L of fluids prior to arrival.  Here in the emergency room, she states that symptoms are starting to improve but she does arrive persistently febrile.  Denies associated chest pain, shortness of breath, diarrhea or other systemic symptoms.   Past Medical History Past Medical History:  Diagnosis Date   Allergy    Anemia    Back spasm    Depression    Fibroid    HSV-2 (herpes simplex virus 2) infection    Joint pain    Knee problem    Migraine 10/19/2014   Obesity    Shortness of breath    Sleep apnea    Vitamin D deficiency    Vitamin deficiency    HX OF LOW VIT D    Patient Active Problem List   Diagnosis Date Noted   Acute calculous cholecystitis 11/25/2022   Menorrhagia 12/27/2019   Hiatal hernia 03/31/2017   Sleep disturbance 03/31/2017   Abnormal mammogram of left breast 12/01/2014   Migraine 10/19/2014   Appetite disorder 10/13/2013   Extreme obesity 10/13/2013   Back spasm 04/14/2013   Fibroid    Vitamin D deficiency    HSV-2 (herpes simplex virus 2) infection    Home Medication(s) Prior to Admission medications   Medication Sig Start Date End Date Taking? Authorizing Provider  cholecalciferol (VITAMIN D3) 25 MCG (1000 UNIT) tablet Take 1,000 Units by mouth daily.     [provider]  fluticasone (FLONASE) 50 MCG/ACT nasal spray Place 1 spray into both nostrils daily. 04/03/23   Gustavus Bryant, FNP  guaiFENesin 200 MG tablet Take 1 tablet (200 mg total) by mouth every 4 (four) hours as needed for cough or to loosen phlegm. 04/03/23   Gustavus Bryant, FNP  naproxen (NAPROSYN) 375 MG tablet Take 1 tablet (375 mg total) by mouth 2 (two) times daily. 12/28/23   Adriane Gabbert, MD  nystatin (MYCOSTATIN) 100000 UNIT/ML suspension Use as directed 5 mLs (500,000 Units total) in the mouth or throat 4 (four) times daily. Swish and spit 12/28/23   Violia Knopf, MD  ondansetron (ZOFRAN-ODT) 4 MG disintegrating tablet Take 1 tablet (4 mg total) by mouth every 8 (eight) hours as needed. 02/16/23   Karie Mainland, Amjad, PA-C  ondansetron (ZOFRAN-ODT) 4 MG disintegrating tablet Take 1 tablet (4 mg total) by mouth every 8 (eight) hours as needed for nausea or vomiting. 12/28/23   Ayyub Krall, MD  prochlorperazine (COMPAZINE) 10 MG tablet Take 1 tablet (10 mg total) by mouth 2 (two) times daily as needed for nausea or vomiting. 12/27/23   Pollyann Savoy, MD  vitamin C (ASCORBIC ACID) 250 MG tablet Take 250 mg by mouth daily.    [provider]  Past Surgical History Past Surgical History:  Procedure Laterality Date   CHOLECYSTECTOMY N/A 11/25/2022   Procedure: LAPAROSCOPIC CHOLECYSTECTOMY;  Surgeon: Berna Bue, MD;  Location: [facility];  Service: General;  Laterality: N/A;   TUMOR REMOVAL     fibroid   VAGINAL DELIVERY     x 1   Family History Family History  Problem Relation Age of Onset   Breast cancer Mother 46   Thyroid disease Mother    Obesity Mother    Diabetes Mother    Colon cancer Father 37   Diabetes Father    Heart disease Father    Hyperlipidemia Father    Hypertension Father    Sleep apnea Father    Alcoholism  Father    Obesity Father    Migraines Brother    Breast cancer Maternal Aunt    Breast cancer Maternal Aunt    Breast cancer Maternal Grandmother 40   Diabetes Paternal Grandmother    Migraines Son    Esophageal cancer Neg Hx    Stomach cancer Neg Hx     Social History Social History   Tobacco Use   Smoking status: Never   Smokeless tobacco: Never  Vaping Use   Vaping status: Never Used  Substance Use Topics   Alcohol use: Not Currently   Drug use: No   Allergies Eye drops [naphazoline-polyethyl glycol]  Review of Systems Review of Systems  Musculoskeletal:  Positive for arthralgias and myalgias.  Neurological:  Positive for dizziness and light-headedness.    Physical Exam Vital Signs  I have reviewed the triage vital signs BP (!) 110/54   Pulse 83   Temp 99.3 F (37.4 C) (Oral)   Resp (!) 25   SpO2 95%   Physical Exam Vitals and nursing note reviewed.  Constitutional:      General: She is not in acute distress.    Appearance: She is well-developed.  HENT:     Head: Normocephalic and atraumatic.  Eyes:     Conjunctiva/sclera: Conjunctivae normal.  Cardiovascular:     Rate and Rhythm: Normal rate and regular rhythm.     Heart sounds: No murmur heard. Pulmonary:     Effort: Pulmonary effort is normal. No respiratory distress.     Breath sounds: Normal breath sounds.  Abdominal:     Palpations: Abdomen is soft.     Tenderness: There is no abdominal tenderness.  Musculoskeletal:        General: No swelling.     Cervical back: Neck supple.  Skin:    General: Skin is warm and dry.     Capillary Refill: Capillary refill takes less than 2 seconds.  Neurological:     Mental Status: She is alert.  Psychiatric:        Mood and Affect: Mood normal.     ED Results and Treatments Labs (all labs ordered are listed, but only abnormal results are displayed) Labs Reviewed  CBC WITH DIFFERENTIAL/PLATELET - Abnormal; Notable for the following components:       Result Value   WBC 2.9 (*)    Neutro Abs 1.2 (*)    All other components within normal limits  I-STAT CHEM 8, ED - Abnormal; Notable for the following components:   Calcium, Ion 1.02 (*)    All other components within normal limits  COMPREHENSIVE METABOLIC PANEL  TROPONIN I (HIGH SENSITIVITY)  TROPONIN I (HIGH SENSITIVITY)  Radiology DG Chest 2 View Result Date: 12/28/2023 CLINICAL DATA:  r/o PNA EXAM: CHEST - 2 VIEW COMPARISON:  Chest x-ray 02/07/2017 FINDINGS: The heart and mediastinal contours are within normal limits. No focal consolidation. No pulmonary edema. No pleural effusion. No pneumothorax. No acute osseous abnormality. IMPRESSION: No active cardiopulmonary disease. Electronically Signed   By: Tish Frederickson M.D.   On: 12/28/2023 19:23    Pertinent labs & imaging results that were available during my care of the patient were reviewed by me and considered in my medical decision making (see MDM for details).  Medications Ordered in ED Medications  lactated ringers bolus 1,000 mL (0 mLs Intravenous Stopped 12/28/23 2111)  acetaminophen (TYLENOL) tablet 1,000 mg (1,000 mg Oral Given 12/28/23 1901)                                                                                                                                     Procedures Procedures  (including critical care time)  Medical Decision Making / ED Course   This patient presents to the ED for concern of cough, presyncope, headache, this involves an extensive number of treatment options, and is a complaint that carries with it a high risk of complications and morbidity.  The differential diagnosis includes influenza, dehydration, electrolyte abnormality, orthostatic near syncope, cardiogenic near syncope  MDM: Patient seen department for evaluation of cough, headache and near syncope.  Physical  exam is largely unremarkable.  Neurologic exam unremarkable with no focal motor or sensory deficits.  Laboratory evaluation is reassuring outside of a mild leukopenia 2.9.  Electrolytes are reassuringly normal and has not some troponin is normal.  No dysrhythmia seen on cardiac monitoring.  Chest x-ray is unremarkable.  Patient rehydrated with fluid resuscitation given Tylenol.  Reevaluation, symptoms are significant improved and she is able to ambulate without difficulty.  No return of near syncope or lightheadedness with changes in position on reevaluation.  Presentation consistent with dehydration in the setting of an influenza infection.  She currently does not meet inpatient criteria for admission and will be discharged with outpatient follow-up.  Medication sent to pharmacy and patient given return precautions of which she voiced understanding.   Additional history obtained:  -External records from outside source obtained and reviewed including: Chart review including previous notes, labs, imaging, consultation notes   Lab Tests: -I ordered, reviewed, and interpreted labs.   The pertinent results include:   Labs Reviewed  CBC WITH DIFFERENTIAL/PLATELET - Abnormal; Notable for the following components:      Result Value   WBC 2.9 (*)    Neutro Abs 1.2 (*)    All other components within normal limits  I-STAT CHEM 8, ED - Abnormal; Notable for the following components:   Calcium, Ion 1.02 (*)    All other components within normal limits  COMPREHENSIVE METABOLIC PANEL  TROPONIN I (HIGH SENSITIVITY)  TROPONIN I (HIGH SENSITIVITY)  Imaging Studies ordered: I ordered imaging studies including CXR I independently visualized and interpreted imaging. I agree with the radiologist interpretation   Medicines ordered and prescription drug management: Meds ordered this encounter  Medications   lactated ringers bolus 1,000 mL   acetaminophen (TYLENOL) tablet 1,000 mg   DISCONTD:  ondansetron (ZOFRAN-ODT) 4 MG disintegrating tablet    Sig: Take 1 tablet (4 mg total) by mouth every 8 (eight) hours as needed for nausea or vomiting.    Dispense:  20 tablet    Refill:  0   DISCONTD: naproxen (NAPROSYN) 375 MG tablet    Sig: Take 1 tablet (375 mg total) by mouth 2 (two) times daily.    Dispense:  20 tablet    Refill:  0   DISCONTD: nystatin (MYCOSTATIN) 100000 UNIT/ML suspension    Sig: Use as directed 5 mLs (500,000 Units total) in the mouth or throat 4 (four) times daily. Swish and spit    Dispense:  60 mL    Refill:  0   naproxen (NAPROSYN) 375 MG tablet    Sig: Take 1 tablet (375 mg total) by mouth 2 (two) times daily.    Dispense:  20 tablet    Refill:  0   nystatin (MYCOSTATIN) 100000 UNIT/ML suspension    Sig: Use as directed 5 mLs (500,000 Units total) in the mouth or throat 4 (four) times daily. Swish and spit    Dispense:  60 mL    Refill:  0   ondansetron (ZOFRAN-ODT) 4 MG disintegrating tablet    Sig: Take 1 tablet (4 mg total) by mouth every 8 (eight) hours as needed for nausea or vomiting.    Dispense:  20 tablet    Refill:  0    -I have reviewed the patients home medicines and have made adjustments as needed  Critical interventions none    Cardiac Monitoring: The patient was maintained on a cardiac monitor.  I personally viewed and interpreted the cardiac monitored which showed an underlying rhythm of: NSR  Social Determinants of Health:  Factors impacting patients care include: none   Reevaluation: After the interventions noted above, I reevaluated the patient and found that they have :improved  Co morbidities that complicate the patient evaluation  Past Medical History:  Diagnosis Date   Allergy    Anemia    Back spasm    Depression    Fibroid    HSV-2 (herpes simplex virus 2) infection    Joint pain    Knee problem    Migraine 10/19/2014   Obesity    Shortness of breath    Sleep apnea    Vitamin D deficiency    Vitamin  deficiency    HX OF LOW VIT D       Dispostion: I considered admission for this patient, at this time she does not meet inpatient criteria for admission and will be discharged with outpatient follow-up     Final Clinical Impression(s) / ED Diagnoses Final diagnoses:  Influenza  Dehydration     @PCDICTATION @    Glendora Score, MD 12/28/23 2200

## 2023-12-28 NOTE — ED Notes (Signed)
Patient provided with [facility] for oral rehydration.  Patient instructed to drink over 60 mins.

## 2023-12-28 NOTE — ED Notes (Signed)
Patient assisted to ambulate to bathroom.  Steady gait noted

## 2023-12-29 ENCOUNTER — Other Ambulatory Visit: Payer: Self-pay
